# Patient Record
Sex: Female | Born: 1975
Health system: Southern US, Community
[De-identification: ages and names within clinical notes are randomized; demographics above are authoritative.]

## PROBLEM LIST (undated history)

## (undated) DIAGNOSIS — E559 Vitamin D deficiency, unspecified: Secondary | ICD-10-CM

## (undated) DIAGNOSIS — E669 Obesity, unspecified: Secondary | ICD-10-CM

## (undated) HISTORY — DX: Vitamin D deficiency, unspecified: E55.9

## (undated) HISTORY — DX: Obesity, unspecified: E66.9

## (undated) HISTORY — PX: DILATION AND CURETTAGE OF UTERUS: SHX78

---

## 1998-07-19 ENCOUNTER — Ambulatory Visit (HOSPITAL_COMMUNITY): Admission: RE | Admit: 1998-07-19 | Discharge: 1998-07-19 | Payer: Self-pay | Admitting: *Deleted

## 1998-09-14 ENCOUNTER — Ambulatory Visit (HOSPITAL_COMMUNITY): Admission: RE | Admit: 1998-09-14 | Discharge: 1998-09-14 | Payer: Self-pay | Admitting: Obstetrics and Gynecology

## 2012-12-02 ENCOUNTER — Ambulatory Visit: Payer: 59 | Admitting: Obstetrics & Gynecology

## 2013-09-17 ENCOUNTER — Encounter: Payer: Self-pay | Admitting: Obstetrics & Gynecology

## 2013-09-17 ENCOUNTER — Ambulatory Visit (INDEPENDENT_AMBULATORY_CARE_PROVIDER_SITE_OTHER): Payer: 59 | Admitting: Obstetrics & Gynecology

## 2013-09-17 VITALS — BP 130/91 | HR 93 | Temp 97.7°F | Ht 64.0 in | Wt 238.0 lb

## 2013-09-17 DIAGNOSIS — Z01419 Encounter for gynecological examination (general) (routine) without abnormal findings: Secondary | ICD-10-CM

## 2013-09-17 DIAGNOSIS — Z124 Encounter for screening for malignant neoplasm of cervix: Secondary | ICD-10-CM

## 2013-09-17 DIAGNOSIS — Z113 Encounter for screening for infections with a predominantly sexual mode of transmission: Secondary | ICD-10-CM

## 2013-09-17 LAB — RPR

## 2013-09-17 LAB — HIV ANTIBODY (ROUTINE TESTING W REFLEX): HIV: NONREACTIVE

## 2013-09-17 NOTE — Progress Notes (Signed)
Subjective:     Ashley [name] is a 38 y.o. female here for a routine exam.  Current complaints: annual exam. No concerns at this time.  Personal health questionnaire reviewed: yes.   Gynecologic History No LMP recorded. Contraception: vasectomy Last Pap: ASCUS. Results were: normal  Obstetric History OB History  Gravida Para Term Preterm AB SAB TAB Ectopic Multiple Living  4 2 2  2 2    2     # Outcome Date GA Lbr Len/2nd Weight Sex Delivery Anes PTL Lv  4 SAB 2014          3 TRM 08/30/00 4267w0d  7 lb 6 oz (3.345 kg) M SVD Other  Y  2 TRM 01/29/99 2035w0d  7 lb 4 oz (3.289 kg) F SVD None  Y  1 SAB 1997               The following portions of the patient's history were reviewed and updated as appropriate: allergies, current medications, past family history, past medical history, past social history, past surgical history and problem list.  Review of Systems Pertinent items are noted in HPI.   Objective:    BP 130/91  Pulse 93  Temp(Src) 97.7 F (36.5 C)  Ht 5\' 4"  (1.626 m)  Wt 238 lb (107.956 kg)  BMI 40.83 kg/m2  General Appearance:    Alert, cooperative, no distress, appears stated age  Breast Exam:    No tenderness, masses, or nipple abnormality  Abdomen:     Soft, non-tender, bowel sounds active all four quadrants,    no masses, no organomegaly  Genitalia:    Normal female without lesion, discharge or tenderness     Assessment:   Healthy female exam.   Plan:   Return prn

## 2013-09-18 LAB — WET PREP BY MOLECULAR PROBE
Candida species: NEGATIVE
Gardnerella vaginalis: POSITIVE — AB
Trichomonas vaginosis: NEGATIVE

## 2013-09-18 LAB — PAP IG, CT-NG, RFX HPV ASCU
Chlamydia Probe Amp: NEGATIVE
GC Probe Amp: NEGATIVE

## 2013-09-19 NOTE — Patient Instructions (Signed)

## 2013-10-07 ENCOUNTER — Other Ambulatory Visit: Payer: Self-pay | Admitting: *Deleted

## 2013-10-07 DIAGNOSIS — N760 Acute vaginitis: Secondary | ICD-10-CM

## 2013-10-07 DIAGNOSIS — B9689 Other specified bacterial agents as the cause of diseases classified elsewhere: Secondary | ICD-10-CM

## 2013-10-07 MED ORDER — METRONIDAZOLE 500 MG PO TABS: 500.0000 mg | ORAL_TABLET | Freq: Two times a day (BID) | ORAL | Status: DC

## 2014-06-08 ENCOUNTER — Encounter: Payer: Self-pay | Admitting: Obstetrics & Gynecology

## 2014-08-03 ENCOUNTER — Encounter: Payer: Self-pay | Admitting: *Deleted

## 2014-08-04 ENCOUNTER — Encounter: Payer: Self-pay | Admitting: Obstetrics & Gynecology

## 2015-03-17 ENCOUNTER — Encounter: Payer: Self-pay | Admitting: Certified Nurse Midwife

## 2015-03-17 ENCOUNTER — Ambulatory Visit (INDEPENDENT_AMBULATORY_CARE_PROVIDER_SITE_OTHER): Payer: 59 | Admitting: Certified Nurse Midwife

## 2015-03-17 VITALS — BP 144/82 | HR 102 | Temp 98.6°F | Ht 64.0 in | Wt 233.2 lb

## 2015-03-17 DIAGNOSIS — E669 Obesity, unspecified: Secondary | ICD-10-CM

## 2015-03-17 DIAGNOSIS — B3731 Acute candidiasis of vulva and vagina: Secondary | ICD-10-CM

## 2015-03-17 DIAGNOSIS — A499 Bacterial infection, unspecified: Secondary | ICD-10-CM

## 2015-03-17 DIAGNOSIS — Z01419 Encounter for gynecological examination (general) (routine) without abnormal findings: Secondary | ICD-10-CM | POA: Diagnosis not present

## 2015-03-17 DIAGNOSIS — B373 Candidiasis of vulva and vagina: Secondary | ICD-10-CM

## 2015-03-17 DIAGNOSIS — N760 Acute vaginitis: Secondary | ICD-10-CM | POA: Diagnosis not present

## 2015-03-17 DIAGNOSIS — Z113 Encounter for screening for infections with a predominantly sexual mode of transmission: Secondary | ICD-10-CM | POA: Diagnosis not present

## 2015-03-17 DIAGNOSIS — N898 Other specified noninflammatory disorders of vagina: Secondary | ICD-10-CM | POA: Diagnosis not present

## 2015-03-17 DIAGNOSIS — B9689 Other specified bacterial agents as the cause of diseases classified elsewhere: Secondary | ICD-10-CM

## 2015-03-17 LAB — COMPREHENSIVE METABOLIC PANEL
ALK PHOS: 66 U/L (ref 33–115)
ALT: 15 U/L (ref 6–29)
AST: 19 U/L (ref 10–30)
Albumin: 3.8 g/dL (ref 3.6–5.1)
BUN: 10 mg/dL (ref 7–25)
CO2: 23 mmol/L (ref 20–31)
CREATININE: 0.61 mg/dL (ref 0.50–1.10)
Calcium: 9 mg/dL (ref 8.6–10.2)
Chloride: 103 mmol/L (ref 98–110)
Glucose, Bld: 74 mg/dL (ref 65–99)
Potassium: 4.1 mmol/L (ref 3.5–5.3)
SODIUM: 138 mmol/L (ref 135–146)
Total Bilirubin: 0.3 mg/dL (ref 0.2–1.2)
Total Protein: 6.6 g/dL (ref 6.1–8.1)

## 2015-03-17 LAB — CBC WITH DIFFERENTIAL/PLATELET
Basophils Absolute: 0 10*3/uL (ref 0.0–0.1)
Basophils Relative: 0 % (ref 0–1)
Eosinophils Absolute: 0.1 10*3/uL (ref 0.0–0.7)
Eosinophils Relative: 1 % (ref 0–5)
HCT: 38.3 % (ref 36.0–46.0)
Hemoglobin: 12.7 g/dL (ref 12.0–15.0)
Lymphocytes Relative: 29 % (ref 12–46)
Lymphs Abs: 2.8 10*3/uL (ref 0.7–4.0)
MCH: 27.6 pg (ref 26.0–34.0)
MCHC: 33.2 g/dL (ref 30.0–36.0)
MCV: 83.3 fL (ref 78.0–100.0)
MPV: 8.6 fL (ref 8.6–12.4)
Monocytes Absolute: 0.6 10*3/uL (ref 0.1–1.0)
Monocytes Relative: 6 % (ref 3–12)
Neutro Abs: 6.3 10*3/uL (ref 1.7–7.7)
Neutrophils Relative %: 64 % (ref 43–77)
Platelets: 313 10*3/uL (ref 150–400)
RBC: 4.6 MIL/uL (ref 3.87–5.11)
RDW: 14.5 % (ref 11.5–15.5)
WBC: 9.8 10*3/uL (ref 4.0–10.5)

## 2015-03-17 LAB — CHOLESTEROL, TOTAL: CHOLESTEROL: 223 mg/dL — AB (ref 125–200)

## 2015-03-17 LAB — TSH: TSH: 1.918 u[IU]/mL (ref 0.350–4.500)

## 2015-03-17 LAB — TRIGLYCERIDES: TRIGLYCERIDES: 120 mg/dL (ref <150)

## 2015-03-17 LAB — HDL CHOLESTEROL: HDL: 59 mg/dL (ref >=46)

## 2015-03-17 MED ORDER — TINIDAZOLE 500 MG PO TABS: 2.0000 g | ORAL_TABLET | Freq: Every day | ORAL | Status: AC

## 2015-03-17 MED ORDER — FLUCONAZOLE 100 MG PO TABS: 100.0000 mg | ORAL_TABLET | Freq: Once | ORAL | Status: DC

## 2015-03-17 NOTE — Progress Notes (Signed)
Patient ID: Ashley [name], female   DOB: [birth year], 39 y.o.   MRN: [id number]    Subjective:        Ashley [name] is a 39 y.o. female here for a routine exam.  Current complaints: vaginal discharge with odor, denies itching.  Currently sexually active, last sexual intercours about 1 month ago. Is divorced and does not desire any more children.   Desires full STD screening exam.    Personal health questionnaire:  Is patient Ashkenazi Jewish, have a family history of breast and/or ovarian cancer: no Is there a family history of uterine cancer diagnosed at age < 60, gastrointestinal cancer, urinary tract cancer, family member who is a Personnel officer syndrome-associated carrier: no Is the patient overweight and hypertensive, family history of diabetes, personal history of gestational diabetes, preeclampsia or PCOS: yes Is patient over 79, have PCOS,  family history of premature CHD under age 79, diabetes, smoke, have hypertension or peripheral artery disease:  yes At any time, has a partner hit, kicked or otherwise hurt or frightened you?: no Over the past 2 weeks, have you felt down, depressed or hopeless?: no Over the past 2 weeks, have you felt little interest or pleasure in doing things?:no   Gynecologic History Patient's last menstrual period was 02/28/2015 (exact date). Contraception: none Last Pap: 09/17/2013. Results were: normal Last mammogram: N/A.   Obstetric History OB History  Gravida Para Term Preterm AB SAB TAB Ectopic Multiple Living  4 2 2  2 2    2     # Outcome Date GA Lbr Len/2nd Weight Sex Delivery Anes PTL Lv  4 SAB 2014          3 Term 08/30/00 [redacted]w[redacted]d  7 lb 6 oz (3.345 kg) M Vag-Spont Other  Y  2 Term 01/29/99 [redacted]w[redacted]d  7 lb 4 oz (3.289 kg) F Vag-Spont None  Y  1 SAB 1997              History reviewed. No pertinent past medical history.  Past Surgical History  Procedure Laterality Date  . Dilation and curettage of uterus       Current outpatient  prescriptions:  .  fluconazole (DIFLUCAN) 100 MG tablet, Take 1 tablet (100 mg total) by mouth once. Repeat dose in 48-72 hour., Disp: 3 tablet, Rfl: 0 .  metroNIDAZOLE (FLAGYL) 500 MG tablet, Take 1 tablet (500 mg total) by mouth 2 (two) times daily. (Patient not taking: Reported on 03/17/2015), Disp: 14 tablet, Rfl: 0 .  tinidazole (TINDAMAX) 500 MG tablet, Take 4 tablets (2,000 mg total) by mouth daily with breakfast., Disp: 12 tablet, Rfl: 0 No Known Allergies  Social History  Substance Use Topics  . Smoking status: Former Smoker    Start date: 09/17/2009  . Smokeless tobacco: Never Used  . Alcohol Use: No    History reviewed. No pertinent family history.    Review of Systems  Constitutional: negative for fatigue and weight loss Respiratory: negative for cough and wheezing Cardiovascular: negative for chest pain, fatigue and palpitations Gastrointestinal: negative for abdominal pain and change in bowel habits Musculoskeletal:negative for myalgias Neurological: negative for gait problems and tremors Behavioral/Psych: negative for abusive relationship, depression Endocrine: negative for temperature intolerance   Genitourinary:negative for abnormal menstrual periods, genital lesions, hot flashes, and sexual problems. + vaginal discharge Integument/breast: negative for breast lump, breast tenderness, nipple discharge and skin lesion(s)    Objective:       BP 144/82 mmHg  Pulse 102  Temp(Src)  98.6 F (37 C)  Ht  (1.626 m)  Wt 233 lb 3.2 oz (105.779 kg)  BMI 40.01 kg/m2  LMP 02/28/2015 (Exact Date) General:   alert  Skin:   no rash or abnormalities  Lungs:   clear to auscultation bilaterally  Heart:   regular rate and rhythm, S1, S2 normal, no murmur, click, rub or gallop  Breasts:   normal without suspicious masses, skin or nipple changes or axillary nodes  Abdomen:  normal findings: no organomegaly, soft, non-tender and no hernia obese  Pelvis:  External genitalia:  normal general appearance Urinary system: urethral meatus normal and bladder without fullness, nontender Vaginal: normal without tenderness, induration or masses, + thin gray vaginal discharge with odor Cervix: normal appearance, friable on exam Adnexa: normal bimanual exam Uterus: anteverted and non-tender, normal size   +clue cells and occasional yeast bud on wet prep, no trichomonads seen.  Lab Review Urine pregnancy test Labs reviewed yes Radiologic studies reviewed no  50% of 30 min visit spent on counseling and coordination of care.   Assessment:  BV VVC  Healthy female exam.   Obese STD screening Contraception counseling  Elevated B/P in office today Plan:  Encouraged patient to check her blood pressure at work a few more times, if still elevated to contact her PCP.     Education reviewed: calcium supplements, depression evaluation, low fat, low cholesterol diet, safe sex/STD prevention, self breast exams, skin cancer screening and weight bearing exercise. Contraception: none. Follow up in: 1 year.   Meds ordered this encounter  Medications  . tinidazole (TINDAMAX) 500 MG tablet    Sig: Take 4 tablets (2,000 mg total) by mouth daily with breakfast.    Dispense:  12 tablet    Refill:  0  . fluconazole (DIFLUCAN) 100 MG tablet    Sig: Take 1 tablet (100 mg total) by mouth once. Repeat dose in 48-72 hour.    Dispense:  3 tablet    Refill:  0   Orders Placed This Encounter  Procedures  . HIV antibody (with reflex)  . Hepatitis B surface antigen  . RPR  . Hepatitis C antibody  . CBC with Differential/Platelet  . Comprehensive metabolic panel  . TSH  . Cholesterol, total  . Triglycerides  . HDL cholesterol  . Hemoglobin A1c   Discussed Paraguard as an option for [facility]. Pamphlet given.

## 2015-03-17 NOTE — Addendum Note (Signed)
Addended by: Marya Landry D on: 03/17/2015 04:16 PM   Modules accepted: Orders

## 2015-03-18 ENCOUNTER — Encounter: Payer: Self-pay | Admitting: Certified Nurse Midwife

## 2015-03-18 DIAGNOSIS — R7303 Prediabetes: Secondary | ICD-10-CM | POA: Insufficient documentation

## 2015-03-18 LAB — HIV ANTIBODY (ROUTINE TESTING W REFLEX): HIV 1&2 Ab, 4th Generation: NONREACTIVE

## 2015-03-18 LAB — HEPATITIS B SURFACE ANTIGEN: Hepatitis B Surface Ag: NEGATIVE

## 2015-03-18 LAB — HEMOGLOBIN A1C
Hgb A1c MFr Bld: 5.8 % — ABNORMAL HIGH (ref <5.7)
Mean Plasma Glucose: 120 mg/dL — ABNORMAL HIGH (ref <117)

## 2015-03-18 LAB — RPR

## 2015-03-18 LAB — HEPATITIS C ANTIBODY: HCV Ab: NEGATIVE

## 2015-03-19 LAB — PAP IG AND HPV HIGH-RISK: HPV DNA HIGH RISK: NOT DETECTED

## 2015-03-20 LAB — SURESWAB, VAGINOSIS/VAGINITIS PLUS
ATOPOBIUM VAGINAE: 6.2 Log (cells/mL)
C. PARAPSILOSIS, DNA: NOT DETECTED
C. albicans, DNA: NOT DETECTED
C. glabrata, DNA: NOT DETECTED
C. trachomatis RNA, TMA: NOT DETECTED
C. tropicalis, DNA: NOT DETECTED
GARDNERELLA VAGINALIS: 7.7 Log (cells/mL)
LACTOBACILLUS SPECIES: NOT DETECTED Log (cells/mL)
MEGASPHAERA SPECIES: 7.1 Log (cells/mL)
N. GONORRHOEAE RNA, TMA: NOT DETECTED
T. vaginalis RNA, QL TMA: DETECTED — AB

## 2015-03-23 ENCOUNTER — Other Ambulatory Visit: Payer: Self-pay | Admitting: Certified Nurse Midwife

## 2015-04-02 ENCOUNTER — Other Ambulatory Visit: Payer: Self-pay | Admitting: Certified Nurse Midwife

## 2015-04-02 DIAGNOSIS — R7303 Prediabetes: Secondary | ICD-10-CM

## 2015-04-06 ENCOUNTER — Other Ambulatory Visit: Payer: Self-pay | Admitting: *Deleted

## 2015-04-06 DIAGNOSIS — A599 Trichomoniasis, unspecified: Secondary | ICD-10-CM

## 2015-04-06 DIAGNOSIS — N760 Acute vaginitis: Secondary | ICD-10-CM

## 2015-04-06 DIAGNOSIS — B9689 Other specified bacterial agents as the cause of diseases classified elsewhere: Secondary | ICD-10-CM

## 2015-04-06 MED ORDER — METRONIDAZOLE 500 MG PO TABS: ORAL_TABLET | ORAL | Status: DC

## 2015-05-12 ENCOUNTER — Ambulatory Visit: Payer: Self-pay | Admitting: Dietician

## 2016-05-12 ENCOUNTER — Other Ambulatory Visit (HOSPITAL_COMMUNITY): Payer: Self-pay | Admitting: General Surgery

## 2016-05-18 ENCOUNTER — Encounter: Payer: 59 | Attending: General Surgery | Admitting: Dietician

## 2016-05-18 NOTE — Patient Instructions (Signed)
Follow Pre-Op Goals Try Protein Shakes Call [facility] at [phone] when surgery is scheduled to enroll in Pre-Op Class  Things to remember:  Please always be honest with us. We want to support you!  If you have any questions or concerns in between appointments, please call or email Liz, Leslie, or Laurie.  The diet after surgery will be high protein and low in carbohydrate.  Vitamins and calcium need to be taken for the rest of your life.  Feel free to include support people in any classes or appointments.   Supplement recommendations:  Before Surgery   1 Complete Multivitamin with Iron  3000 IU Vitamin D3  After Surgery   2 Chewable Multivitamins  **Best Choice - Bariatric Advantage Advanced Multi EA      3 Chewable Calcium (500 mg each, total 1200-1500 mg per day)  **Best Choice - Celebrate, Bariatric Advantage, or Wellesse  Other Options:    2 Flinstones Complete + up to 100 mg Thiamin + 2000-3000 IU Vitamin D3 + 350-500 mcg Vitamin B12 + 30-45 mg Iron (with history of deficiency)  2 Celebrate MultiComplete with 18 mg Iron (this provides 6000 IU of  Vitamin D3)  4 Celebrate Essential Multi 2 in 1 (has calcium) + 18-60 mg separate  iron  Vitamins and Calcium are available at:   [facility]   [address], [place], [place] [address]   [email or web address]  [email or web address]  [email or web address]   

## 2016-05-18 NOTE — Progress Notes (Signed)
  Pre-Op Assessment Visit:  Pre-Operative Sleeve Gastrectomy Surgery  Medical Nutrition Therapy:  Appt start time: 1600   End time:  1655.  Patient was seen on 05/18/2016 for Pre-Operative Nutrition Assessment. Assessment and [facility] approval [facility] coordinator on 05/18/2016.   Preferred Learning Style:    No preference indicated   Learning Readiness:   Ready  Handouts given during visit include:  Pre-Op Goals Bariatric Surgery Protein Shakes   During the appointment today the following Pre-Op Goals were reviewed with the patient: Maintain or lose weight as instructed by your surgeon Make healthy food choices Begin to limit portion sizes Limited concentrated sugars and fried foods Keep fat/sugar in the single digits per serving on   food labels Practice CHEWING your food  (aim for 30 chews per bite or until applesauce consistency) Practice not drinking 15 minutes before, during, and 30 minutes after each meal/snack Avoid all carbonated beverages  Avoid/limit caffeinated beverages  Avoid all sugar-sweetened beverages Consume 3 meals per day; eat every 3-5 hours Make a list of non-food related activities Aim for 64-100 ounces of FLUID daily  Aim for at least 60-80 grams of PROTEIN daily Look for a liquid protein source that contain ?15 g protein and ?5 g carbohydrate  (ex: shakes, drinks, shots)  Patient-Centered Goals: Would like to lower her cholesterol, lower blood sugar, increase physical activity, and reduce the risk of stroke and heart attack  10 confidence/10 importance scale   Demonstrated degree of understanding via:  Teach Back  Teaching Method Utilized:  Visual Auditory Hands on  Barriers to learning/adherence to lifestyle change: none  [facility] to [facility] the [facility] to enroll in Pre-Op and Post-Op Nutrition Education when surgery date is scheduled.

## 2016-05-31 ENCOUNTER — Ambulatory Visit: Payer: Self-pay | Admitting: Skilled Nursing Facility1

## 2016-06-14 ENCOUNTER — Ambulatory Visit: Payer: Self-pay | Admitting: Dietician

## 2016-06-19 ENCOUNTER — Encounter: Payer: 59 | Attending: General Surgery | Admitting: Dietician

## 2016-06-19 NOTE — Progress Notes (Signed)
  6 Months Supervised Weight Loss Visit:   Pre-Operative Sleeve Gastrectomy Surgery  Medical Nutrition Therapy:  Appt start time: 1215 end time:  1230.  Primary concerns today: Supervised Weight Loss Visit. Returns with a 5 lb weight gain since last month. Has been working at home and doing more frying instead of baking. Kids don't like baked food.  Started new job on 10/27. Previous job she lunch provided. Also feels like she needs more activity since previous job was more activity.   Has cut back on portions. Drinking more water and cutting back on soda. Started working on not drinking during meals and chewing. Has not tried protein shakes yet.   Does not eat much sweets (except Halloween candy).   Weight: 249.8 BMI: 42.2  Preferred Learning Style:   No preference indicated   Learning Readiness:   Ready  Progress Towards Goal(s):  In progress.  Handouts given during visit include:  Pre Op Goals   Nutritional Diagnosis:  [place]-3.3 Obesity related to past poor dietary habits and physical inactivity as evidenced by patient attending supervised weight loss for insurance approval of bariatric surgery.    Intervention:  Nutrition counseling provided. Plan: Plan to walk 30 minutes 3 x week. Cut down on fried foods (3 items per week for now). Try 3 protein shakes in the next month.  Work on chewing well. Start working on waiting 30 minutes after a meal before drinking.   Teaching Method Utilized:  Visual Auditory Hands on  Barriers to learning/adherence to lifestyle change: none  Demonstrated degree of understanding via:  Teach Back   Monitoring/Evaluation:  Dietary intake, exercise, and body weight. Follow up in 1 months for 6 month supervised weight loss visit.

## 2016-06-19 NOTE — Patient Instructions (Signed)
Plan to walk 30 minutes 3 x week. Cut down on fried foods (3 items per week for now). Try 3 protein shakes in the next month.  Work on chewing well. Start working on waiting 30 minutes after a meal before drinking.

## 2016-07-19 ENCOUNTER — Encounter: Payer: 59 | Attending: General Surgery | Admitting: Dietician

## 2016-07-19 NOTE — Patient Instructions (Addendum)
Plan to walk 30 minutes 3 x week. Cut down on fried foods (3 items per week for now). Try different protein shakes in the next month.  Continue to work on chewing well. Continue working on waiting 30 minutes after a meal before drinking.  Try mixing quest shake with skim milk or unsweetened almond or soy milk. Vanilla is ok.  Work on cutting down on bread to 2 slices per day.

## 2016-07-19 NOTE — Progress Notes (Addendum)
  6 Months Supervised Weight Loss Visit:   Pre-Operative Sleeve Gastrectomy Surgery  Medical Nutrition Therapy:  Appt start time: 515 end time:  530  Primary concerns today: Supervised Weight Loss Visit. Returns with a 1 lb weight loss since last month. Tried Quest protein shake and did not like it.   Doing some walking on weekends about 2 x week when shopping. Tried to cut back on fried foods. Cutting back on eating out.   Chewing well most of the time. Enjoying food more!  Started working on not drinking during meals.   Weight: 248.6 lbs BMI: 42.0  Preferred Learning Style:   No preference indicated   Learning Readiness:   Ready  Progress Towards Goal(s):  In progress.  Handouts given during visit include:  Meal card   Nutritional Diagnosis:  [place]-3.3 Obesity related to past poor dietary habits and physical inactivity as evidenced by patient attending supervised weight loss for insurance approval of bariatric surgery.    Intervention:  Nutrition counseling provided. Plan: Plan to walk 30 minutes 3 x week. Cut down on fried foods (3 items per week for now). Try 3 protein shakes in the next month.  Work on chewing well. Start working on waiting 30 minutes after a meal before drinking.   Teaching Method Utilized:  Visual Auditory Hands on  Barriers to learning/adherence to lifestyle change: none  Demonstrated degree of understanding via:  Teach Back   Monitoring/Evaluation:  Dietary intake, exercise, and body weight. Follow up in 1 months for 6 month supervised weight loss visit.

## 2016-08-09 ENCOUNTER — Ambulatory Visit (HOSPITAL_COMMUNITY)
Admission: RE | Admit: 2016-08-09 | Discharge: 2016-08-09 | Disposition: A | Discharge status: Home / Self Care | Payer: 59 | Source: Ambulatory Visit | Attending: General Surgery | Admitting: General Surgery

## 2016-08-09 ENCOUNTER — Other Ambulatory Visit: Payer: Self-pay

## 2016-08-09 DIAGNOSIS — Z01818 Encounter for other preprocedural examination: Secondary | ICD-10-CM | POA: Diagnosis not present

## 2016-08-09 DIAGNOSIS — R05 Cough: Secondary | ICD-10-CM | POA: Diagnosis not present

## 2016-08-16 ENCOUNTER — Encounter: Payer: 59 | Attending: General Surgery | Admitting: Skilled Nursing Facility1

## 2016-08-16 ENCOUNTER — Encounter: Payer: Self-pay | Admitting: Skilled Nursing Facility1

## 2016-08-16 DIAGNOSIS — E6609 Other obesity due to excess calories: Secondary | ICD-10-CM

## 2016-08-16 NOTE — Patient Instructions (Addendum)
-  Try to stay active even if it is cold: the treadmill at your mom's house  -Keep working on cutting out fried foods and bread   -Start working on taking small bites and chewing those bites 30 times before swallowing  -Start working on waiting 30 minutes after you have finished eating before you drink

## 2016-08-16 NOTE — Progress Notes (Signed)
  6 Months Supervised Weight Loss Visit:   Pre-Operative Sleeve Gastrectomy Surgery  Medical Nutrition Therapy:  Appt start time: 515 end time:  530  Primary concerns today: Supervised Weight Loss Visit. Returns with a 2 lb weight gain since last month. Tried Quest protein shake and did not like it.   Doing some walking on weekends about 2 x week when shopping. Tried to cut back on fried foods. Cutting back on eating out.   Pt states she found some protein shakes she liked. Pt states she has not been walking due to the cold.   Chewing well most of the time. Enjoying food more!  Started working on not drinking during meals.   Weight: 250 lbs BMI: 42.38  Preferred Learning Style:   No preference indicated   Learning Readiness:   Ready  Progress Towards Goal(s):  In progress.  Handouts given during visit include:  Meal card   Nutritional Diagnosis:  [place]-3.3 Obesity related to past poor dietary habits and physical inactivity as evidenced by patient attending supervised weight loss for insurance approval of bariatric surgery.    Intervention:  Nutrition counseling provided. Plan: -Try to stay active even if it is cold: the treadmill at your mom's house -Keep working on cutting out fried foods and bread -Start working on taking small bites and chewing those bites 30 times before swallowing -Start working on waiting 30 minutes after you have finished eating before you drink  Teaching Method Utilized:  Visual Auditory Hands on  Barriers to learning/adherence to lifestyle change: none  Demonstrated degree of understanding via:  Teach Back   Monitoring/Evaluation:  Dietary intake, exercise, and body weight. Follow up in 1 months for 6 month supervised weight loss visit.

## 2016-08-17 ENCOUNTER — Ambulatory Visit: Payer: 59 | Admitting: Dietician

## 2016-09-13 ENCOUNTER — Encounter: Payer: 59 | Attending: General Surgery | Admitting: Skilled Nursing Facility1

## 2016-09-13 ENCOUNTER — Encounter: Payer: Self-pay | Admitting: Skilled Nursing Facility1

## 2016-09-13 DIAGNOSIS — E669 Obesity, unspecified: Secondary | ICD-10-CM

## 2016-09-13 NOTE — Progress Notes (Signed)
  6 Months Supervised Weight Loss Visit:   Pre-Operative Sleeve Gastrectomy Surgery  Medical Nutrition Therapy:  Appt start time: 515 end time:  530  Primary concerns today: Supervised Weight Loss Visit. Returns with a 3 lb weight loss since last month. Tried Quest protein shake and did not like it.   Doing some walking on weekends about 2 x week when shopping. Tried to cut back on fried foods. Cutting back on eating out.   Pt states she had the flu a few weeks ago. Pt states everything is going well and is excited for the surgery. Pt states she has cut out milk due to mucous and eating more vegetables.    Weight: 247.6.4 lbs BMI: 41.81  Dietary recall: Ham egg and cheese sandwich Fred fish Sandwich from [employer] Chili  Bagel with butter Beverages: water,   Preferred Learning Style:   No preference indicated   Learning Readiness:   Ready  Progress Towards Goal(s):  In progress.  Handouts given during visit include:  Meal card   Nutritional Diagnosis:  [place]-3.3 Obesity related to past poor dietary habits and physical inactivity as evidenced by patient attending supervised weight loss for insurance approval of bariatric surgery.    Intervention:  Nutrition counseling provided. Plan: -Try to stay active even if it is cold: the treadmill at your mom's house -Keep working on cutting out fried foods and bread -Start working on taking small bites and chewing those bites 30 times before swallowing -Start working on waiting 30 minutes after you have finished eating before you drink  Teaching Method Utilized:  Visual Auditory Hands on  Barriers to learning/adherence to lifestyle change: none  Demonstrated degree of understanding via:  Teach Back   Monitoring/Evaluation:  Dietary intake, exercise, and body weight. Follow up in 1 months for 6 month supervised weight loss visit.

## 2016-09-18 ENCOUNTER — Ambulatory Visit: Payer: 59 | Admitting: Dietician

## 2016-10-12 ENCOUNTER — Encounter: Payer: 59 | Attending: General Surgery | Admitting: Skilled Nursing Facility1

## 2016-10-12 ENCOUNTER — Encounter: Payer: Self-pay | Admitting: Skilled Nursing Facility1

## 2016-10-12 DIAGNOSIS — E669 Obesity, unspecified: Secondary | ICD-10-CM

## 2016-10-12 NOTE — Progress Notes (Signed)
  6 Months Supervised Weight Loss Visit:   Pre-Operative Sleeve Gastrectomy Surgery  Medical Nutrition Therapy:  Appt start time: 515 end time:  530  Primary concerns today: Supervised Weight Loss Visit. Returns with a 3 lb weight loss since last month. Tried Quest protein shake and did not like it.   Doing some walking on weekends about 2 x week when shopping. Tried to cut back on fried foods. Cutting back on eating out.   Pt states she had the flu a few weeks ago. Pt states everything is going well and is excited for the surgery. Pt states she has cut out milk due to mucous and eating more vegetables.    Pt arrives having lost 3 pounds. Pt states this is her last appointment.   Weight: 244.5 lbs BMI: 41.32  Dietary recall: bojangles----granola bar Fred fish Sandwich from [employer] Protein barf Chili  Bagel with butter Beverages: water,   Preferred Learning Style:   No preference indicated   Learning Readiness:   Ready  Progress Towards Goal(s):  In progress.  Handouts given during visit include:  Meal card   Nutritional Diagnosis:  [place]-3.3 Obesity related to past poor dietary habits and physical inactivity as evidenced by patient attending supervised weight loss for insurance approval of bariatric surgery.    Intervention:  Nutrition counseling provided. Plan: -Try to stay active even if it is cold: the treadmill at your mom's house -Keep working on cutting out fried foods and bread -Start working on taking small bites and chewing those bites 30 times before swallowing -Start working on waiting 30 minutes after you have finished eating before you drink  Teaching Method Utilized:  Visual Auditory Hands on  Barriers to learning/adherence to lifestyle change: none  Demonstrated degree of understanding via:  Teach Back   Monitoring/Evaluation:  Dietary intake, exercise, and body weight. Follow up in 1 months for 6 month supervised weight loss visit.

## 2016-10-23 ENCOUNTER — Encounter: Payer: Self-pay | Admitting: Skilled Nursing Facility1

## 2016-10-23 ENCOUNTER — Encounter: Payer: 59 | Admitting: Skilled Nursing Facility1

## 2016-10-23 DIAGNOSIS — E669 Obesity, unspecified: Secondary | ICD-10-CM

## 2016-10-23 NOTE — Progress Notes (Signed)
  Pre-Operative Nutrition Class:  Appt start time: 3943   End time:  1830.  Patient was seen on 10/23/16 for Pre-Operative Bariatric Surgery Education at [facility].   Surgery date:  Surgery type: sleeve gastrectomy Start weight at [facility]: 250 Weight today: 246.3  TANITA  BODY COMP RESULTS     BMI (kg/m^2) N/A   Fat Mass (lbs)    Fat Free Mass (lbs)    Total Body Water (lbs)    Samples given per MNT protocol. Patient educated on appropriate usage: Bariatric Advantage Multivitamin Lot # [id number] Exp: 6/19  Bariatric Advantage Calcium Citrate Lot # [id number] Exp: 01/12/17  Renee Pain Protein Shake Lot # [id number] Exp: 06/06/17   The following the learning objectives were met by the patient during this course:  Identify Pre-Op Dietary Goals and will begin 2 weeks pre-operatively  Identify appropriate sources of fluids and proteins   State protein recommendations and appropriate sources pre and post-operatively  Identify Post-Operative Dietary Goals and will follow for 2 weeks post-operatively  Identify appropriate multivitamin and calcium sources  Describe the need for physical activity post-operatively and will follow MD recommendations  State when to call healthcare provider regarding medication questions or post-operative complications  Handouts given during class include:  Pre-Op Bariatric Surgery Diet Handout  Protein Shake Handout  Post-Op Bariatric Surgery Nutrition Handout  [facility] Program Information Flyer  Support Group Information Flyer  [facility] Bariatric Supplements Price List  Follow-Up Plan: Patient will follow-up at [facility] 2 weeks post operatively for diet advancement per MD.

## 2016-10-27 NOTE — Progress Notes (Signed)
NEED ORDERS IN EPIC FOR 4-3- SURGERY PRE OP IS 3-28 

## 2016-10-31 ENCOUNTER — Ambulatory Visit: Payer: Self-pay | Admitting: Surgery

## 2016-11-01 ENCOUNTER — Encounter (HOSPITAL_COMMUNITY)
Admission: RE | Admit: 2016-11-01 | Discharge: 2016-11-01 | Disposition: A | Discharge status: Home / Self Care | Payer: 59 | Source: Ambulatory Visit | Attending: General Surgery | Admitting: General Surgery

## 2016-11-01 ENCOUNTER — Encounter (HOSPITAL_COMMUNITY): Payer: Self-pay

## 2016-11-01 DIAGNOSIS — Z01812 Encounter for preprocedural laboratory examination: Secondary | ICD-10-CM | POA: Insufficient documentation

## 2016-11-01 DIAGNOSIS — Z6841 Body Mass Index (BMI) 40.0 and over, adult: Secondary | ICD-10-CM | POA: Insufficient documentation

## 2016-11-01 LAB — COMPREHENSIVE METABOLIC PANEL
ALK PHOS: 66 U/L (ref 38–126)
ALT: 16 U/L (ref 14–54)
AST: 20 U/L (ref 15–41)
Albumin: 3.6 g/dL (ref 3.5–5.0)
Anion gap: 7 (ref 5–15)
BILIRUBIN TOTAL: 0.5 mg/dL (ref 0.3–1.2)
BUN: 12 mg/dL (ref 6–20)
CALCIUM: 8.6 mg/dL — AB (ref 8.9–10.3)
CHLORIDE: 106 mmol/L (ref 101–111)
CO2: 25 mmol/L (ref 22–32)
CREATININE: 0.81 mg/dL (ref 0.44–1.00)
Glucose, Bld: 90 mg/dL (ref 65–99)
Potassium: 4 mmol/L (ref 3.5–5.1)
Sodium: 138 mmol/L (ref 135–145)
TOTAL PROTEIN: 7.1 g/dL (ref 6.5–8.1)

## 2016-11-01 LAB — CBC WITH DIFFERENTIAL/PLATELET
BASOS ABS: 0 10*3/uL (ref 0.0–0.1)
Basophils Relative: 0 %
EOS PCT: 1 %
Eosinophils Absolute: 0.1 10*3/uL (ref 0.0–0.7)
HEMATOCRIT: 37.4 % (ref 36.0–46.0)
HEMOGLOBIN: 12.2 g/dL (ref 12.0–15.0)
LYMPHS ABS: 3 10*3/uL (ref 0.7–4.0)
Lymphocytes Relative: 32 %
MCH: 26.8 pg (ref 26.0–34.0)
MCHC: 32.6 g/dL (ref 30.0–36.0)
MCV: 82 fL (ref 78.0–100.0)
Monocytes Absolute: 0.5 10*3/uL (ref 0.1–1.0)
Monocytes Relative: 5 %
Neutro Abs: 5.9 10*3/uL (ref 1.7–7.7)
Neutrophils Relative %: 62 %
PLATELETS: 301 10*3/uL (ref 150–400)
RBC: 4.56 MIL/uL (ref 3.87–5.11)
RDW: 15.2 % (ref 11.5–15.5)
WBC: 9.5 10*3/uL (ref 4.0–10.5)

## 2016-11-01 NOTE — Patient Instructions (Addendum)
Duard LarsenChandra F Thrun  11/01/2016   Your procedure is scheduled on: 11/07/2016    [facility]  [place] to 3rd floor to  Short Stay Center at   0515 AM.  Call this number if you have problems the morning of surgery [phone]   Remember: ONLY 1 PERSON MAY GO WITH YOU TO SHORT STAY TO GET  READY MORNING OF YOUR SURGERY.  Do not eat food or drink liquids :After Midnight.     Take these medicines the morning of surgery with A SIP OF WATER: none                                 You may not have any metal on your body including hair pins and              piercings  Do not wear jewelry, make-up, lotions, powders or perfumes, deodorant             Do not wear nail polish.  Do not shave  48 hours prior to surgery.     Do not bring valuables to the hospital. [place] IS NOT             RESPONSIBLE   FOR VALUABLES.  Contacts, dentures or bridgework may not be worn into surgery.  Leave suitcase in the car. After surgery it may be brought to your room.       Special Instructions: coughing and deep breathing exercises, leg exercises  Incentive Spirometer  An incentive spirometer is a tool that can help keep your lungs clear and active. This tool measures how well you are filling your lungs with each breath. Taking long deep breaths may help reverse or decrease the chance of developing breathing (pulmonary) problems (especially infection) following:  A long period of time when you are unable to move or be active. BEFORE THE PROCEDURE   If the spirometer includes an indicator to show your best effort, your nurse or respiratory therapist will set it to a desired goal.  If possible, sit up straight or lean slightly forward. Try not to slouch.  Hold the incentive spirometer in an upright position. INSTRUCTIONS FOR USE  1. Sit on the edge of your bed if possible, or sit up as far as you can in bed or on a chair. 2. Hold the  incentive spirometer in an upright position. 3. Breathe out normally. 4. Place the mouthpiece in your mouth and seal your lips tightly around it. 5. Breathe in slowly and as deeply as possible, raising the piston or the ball toward the top of the column. 6. Hold your breath for 3-5 seconds or for as long as possible. Allow the piston or ball to fall to the bottom of the column. 7. Remove the mouthpiece from your mouth and breathe out normally. 8. Rest for a few seconds and repeat Steps 1 through 7 at least 10 times every 1-2 hours when you are awake. Take your time and take a few normal breaths between deep breaths. 9. The spirometer may include an indicator to show your best effort. Use the indicator as a goal to work toward during each repetition. 10. After each set of 10 deep breaths, practice coughing to be sure your lungs are clear. If you have an incision (the cut  made at the time of surgery), support your incision when coughing by placing a pillow or rolled up towels firmly against it. Once you are able to get out of bed, walk around indoors and cough well. You may stop using the incentive spirometer when instructed by your caregiver.  RISKS AND COMPLICATIONS  Take your time so you do not get dizzy or light-headed.  If you are in pain, you may need to take or ask for pain medication before doing incentive spirometry. It is harder to take a deep breath if you are having pain. AFTER USE  Rest and breathe slowly and easily.  It can be helpful to keep track of a log of your progress. Your caregiver can provide you with a simple table to help with this. If you are using the spirometer at home, follow these instructions: SEEK MEDICAL CARE IF:   You are having difficultly using the spirometer.  You have trouble using the spirometer as often as instructed.  Your pain medication is not giving enough relief while using the spirometer.  You develop fever of 100.5 F (38.1 C) or  higher. SEEK IMMEDIATE MEDICAL CARE IF:   You cough up bloody sputum that had not been present before.  You develop fever of 102 F (38.9 C) or greater.  You develop worsening pain at or near the incision site. MAKE SURE YOU:   Understand these instructions.  Will watch your condition.  Will get help right away if you are not doing well or get worse. Document Released: 12/04/2006 Document Revised: 10/16/2011 Document Reviewed: 02/04/2007 ExitCare Patient Information 2014 [place] [employer].   ________________________________________________________________________               Please read over the following fact sheets you were given: _____________________________________________________________________             [facility] - Preparing for Surgery Before surgery, you can play an important role.  Because skin is not sterile, your skin needs to be as free of germs as possible.  You can reduce the number of germs on your skin by washing with CHG (chlorahexidine gluconate) soap before surgery.  CHG is an antiseptic cleaner which kills germs and bonds with the skin to continue killing germs even after washing. Please DO NOT use if you have an allergy to CHG or antibacterial soaps.  If your skin becomes reddened/irritated stop using the CHG and inform your nurse when you arrive at Short Stay. Do not shave (including legs and underarms) for at least 48 hours prior to the first CHG shower.  You may shave your face/neck. Please follow these instructions carefully:  1.  Shower with CHG Soap the night before surgery and the  morning of Surgery.  2.  If you choose to wash your hair, wash your hair first as usual with your  normal  shampoo.  3.  After you shampoo, rinse your hair and body thoroughly to remove the  shampoo.                           4.  Use CHG as you would any other liquid soap.  You can apply chg directly  to the skin and wash                       Gently with a scrungie or  clean washcloth.  5.  Apply the CHG Soap to your body ONLY FROM THE  NECK DOWN.   Do not use on face/ open                           Wound or open sores. Avoid contact with eyes, ears mouth and genitals (private parts).                       Wash face,  Genitals (private parts) with your normal soap.             6.  Wash thoroughly, paying special attention to the area where your surgery  will be performed.  7.  Thoroughly rinse your body with warm water from the neck down.  8.  DO NOT shower/wash with your normal soap after using and rinsing off  the CHG Soap.                9.  Pat yourself dry with a clean towel.            10.  Wear clean pajamas.            11.  Place clean sheets on your bed the night of your first shower and do not  sleep with pets. Day of Surgery : Do not apply any lotions/deodorants the morning of surgery.  Please wear clean clothes to the hospital/surgery center.  FAILURE TO FOLLOW THESE INSTRUCTIONS MAY RESULT IN THE CANCELLATION OF YOUR SURGERY PATIENT SIGNATURE_________________________________  NURSE SIGNATURE__________________________________  ________________________________________________________________________

## 2016-11-06 MED FILL — oxyCODONE HCL 5 MG/5ML SOLN: 5 | 5 days supply | Qty: 100 | Fill #0

## 2016-11-06 NOTE — Anesthesia Preprocedure Evaluation (Addendum)
Anesthesia Evaluation  Patient identified by MRN, date of birth, ID band Patient awake    Reviewed: Allergy & Precautions, NPO status , Patient's Chart, lab work & pertinent test results  History of Anesthesia Complications Negative for: history of anesthetic complications  Airway Mallampati: III  TM Distance: >3 FB Neck ROM: Full    Dental  (+) Teeth Intact, Dental Advisory Given   Pulmonary neg pulmonary ROS, former smoker,    Pulmonary exam normal        Cardiovascular negative cardio ROS Normal cardiovascular exam     Neuro/Psych negative neurological ROS     GI/Hepatic negative GI ROS, Neg liver ROS,   Endo/Other  Morbid obesity  Renal/GU negative Renal ROS     Musculoskeletal negative musculoskeletal ROS (+)   Abdominal   Peds  Hematology negative hematology ROS (+)   Anesthesia Other Findings Day of surgery medications reviewed with the patient.  Reproductive/Obstetrics                            Anesthesia Physical Anesthesia Plan  ASA: III  Anesthesia Plan: General   Post-op Pain Management:    Induction: Intravenous  Airway Management Planned: Oral ETT  Additional Equipment:   Intra-op Plan:   Post-operative Plan: Extubation in OR  Informed Consent:   Plan Discussed with:   Anesthesia Plan Comments:         Anesthesia Quick Evaluation

## 2016-11-07 ENCOUNTER — Inpatient Hospital Stay (HOSPITAL_COMMUNITY)
Admission: RE | Admit: 2016-11-07 | Discharge: 2016-11-08 | DRG: 621 | Disposition: A | Discharge status: Home / Self Care | Payer: 59 | Source: Ambulatory Visit | Attending: General Surgery | Admitting: General Surgery

## 2016-11-07 ENCOUNTER — Encounter (HOSPITAL_COMMUNITY): Payer: Self-pay

## 2016-11-07 ENCOUNTER — Inpatient Hospital Stay (HOSPITAL_COMMUNITY): Payer: 59 | Admitting: Anesthesiology

## 2016-11-07 ENCOUNTER — Encounter (HOSPITAL_COMMUNITY)
Admission: RE | Disposition: A | Discharge status: Home / Self Care | Payer: Self-pay | Source: Ambulatory Visit | Attending: General Surgery

## 2016-11-07 DIAGNOSIS — I10 Essential (primary) hypertension: Secondary | ICD-10-CM | POA: Diagnosis present

## 2016-11-07 DIAGNOSIS — IMO0001 Reserved for inherently not codable concepts without codable children: Secondary | ICD-10-CM

## 2016-11-07 DIAGNOSIS — Z6841 Body Mass Index (BMI) 40.0 and over, adult: Secondary | ICD-10-CM

## 2016-11-07 DIAGNOSIS — Z87891 Personal history of nicotine dependence: Secondary | ICD-10-CM | POA: Diagnosis not present

## 2016-11-07 DIAGNOSIS — E669 Obesity, unspecified: Secondary | ICD-10-CM | POA: Diagnosis present

## 2016-11-07 HISTORY — PX: LAPAROSCOPIC GASTRIC SLEEVE RESECTION: SHX5895

## 2016-11-07 HISTORY — PX: UPPER GI ENDOSCOPY: SHX6162

## 2016-11-07 LAB — CBC
HCT: 35.6 % — ABNORMAL LOW (ref 36.0–46.0)
Hemoglobin: 11.6 g/dL — ABNORMAL LOW (ref 12.0–15.0)
MCH: 27.4 pg (ref 26.0–34.0)
MCHC: 32.6 g/dL (ref 30.0–36.0)
MCV: 84.2 fL (ref 78.0–100.0)
PLATELETS: 288 10*3/uL (ref 150–400)
RBC: 4.23 MIL/uL (ref 3.87–5.11)
RDW: 15.1 % (ref 11.5–15.5)
WBC: 10.4 10*3/uL (ref 4.0–10.5)

## 2016-11-07 LAB — HEMOGLOBIN AND HEMATOCRIT, BLOOD
HCT: 35.4 % — ABNORMAL LOW (ref 36.0–46.0)
HEMOGLOBIN: 11.7 g/dL — AB (ref 12.0–15.0)

## 2016-11-07 LAB — CREATININE, SERUM
CREATININE: 0.75 mg/dL (ref 0.44–1.00)
GFR calc Af Amer: >60 mL/min (ref >60)
GFR calc non Af Amer: >60 mL/min (ref >60)

## 2016-11-07 LAB — PREGNANCY, URINE: PREG TEST UR: NEGATIVE

## 2016-11-07 SURGERY — GASTRECTOMY, SLEEVE, LAPAROSCOPIC
Anesthesia: General | Site: Abdomen

## 2016-11-07 MED ORDER — SUGAMMADEX SODIUM 200 MG/2ML IV SOLN
INTRAVENOUS | Status: DC | PRN
  Administered 2016-11-07: 200 mg via INTRAVENOUS

## 2016-11-07 MED ORDER — ONDANSETRON HCL 4 MG/2ML IJ SOLN
INTRAMUSCULAR | Status: DC | PRN
  Administered 2016-11-07: 4 mg via INTRAVENOUS

## 2016-11-07 MED ORDER — CEFOTETAN DISODIUM-DEXTROSE 2-2.08 GM-% IV SOLR
INTRAVENOUS | Status: AC
  Filled 2016-11-07: qty 50

## 2016-11-07 MED ORDER — DEXAMETHASONE SODIUM PHOSPHATE 10 MG/ML IJ SOLN: 4.0000 mg | INTRAMUSCULAR | Status: DC

## 2016-11-07 MED ORDER — SUFENTANIL CITRATE 50 MCG/ML IV SOLN
INTRAVENOUS | Status: AC
  Filled 2016-11-07: qty 1

## 2016-11-07 MED ORDER — HYDROMORPHONE HCL 1 MG/ML IJ SOLN
INTRAMUSCULAR | Status: AC
  Filled 2016-11-07: qty 1

## 2016-11-07 MED ORDER — SCOPOLAMINE 1 MG/3DAYS TD PT72
1.0000 | MEDICATED_PATCH | TRANSDERMAL | Status: DC
  Filled 2016-11-07: qty 1

## 2016-11-07 MED ORDER — SCOPOLAMINE 1 MG/3DAYS TD PT72
1.0000 | MEDICATED_PATCH | TRANSDERMAL | Status: DC
  Administered 2016-11-07: 1.5 mg via TRANSDERMAL

## 2016-11-07 MED ORDER — SUCCINYLCHOLINE CHLORIDE 200 MG/10ML IV SOSY
PREFILLED_SYRINGE | INTRAVENOUS | Status: AC
  Filled 2016-11-07: qty 10

## 2016-11-07 MED ORDER — DEXAMETHASONE SODIUM PHOSPHATE 10 MG/ML IJ SOLN
INTRAMUSCULAR | Status: DC | PRN
  Administered 2016-11-07: 10 mg via INTRAVENOUS

## 2016-11-07 MED ORDER — ENOXAPARIN SODIUM 30 MG/0.3ML ~~LOC~~ SOLN
30.0000 mg | Freq: Two times a day (BID) | SUBCUTANEOUS | Status: DC
  Administered 2016-11-08: 30 mg via SUBCUTANEOUS
  Filled 2016-11-07: qty 0.3

## 2016-11-07 MED ORDER — ACETAMINOPHEN 500 MG PO TABS
1000.0000 mg | ORAL_TABLET | ORAL | Status: AC
  Administered 2016-11-07: 1000 mg via ORAL
  Filled 2016-11-07: qty 2

## 2016-11-07 MED ORDER — DEXAMETHASONE SODIUM PHOSPHATE 10 MG/ML IJ SOLN
INTRAMUSCULAR | Status: AC
  Filled 2016-11-07: qty 1

## 2016-11-07 MED ORDER — ROCURONIUM BROMIDE 100 MG/10ML IV SOLN
INTRAVENOUS | Status: DC | PRN
  Administered 2016-11-07: 45 mg via INTRAVENOUS
  Administered 2016-11-07: 5 mg via INTRAVENOUS

## 2016-11-07 MED ORDER — CHLORHEXIDINE GLUCONATE CLOTH 2 % EX PADS: 6.0000 | MEDICATED_PAD | Freq: Once | CUTANEOUS | Status: DC

## 2016-11-07 MED ORDER — LACTATED RINGERS IV SOLN
INTRAVENOUS | Status: DC | PRN
  Administered 2016-11-07 (×3): via INTRAVENOUS

## 2016-11-07 MED ORDER — ROCURONIUM BROMIDE 50 MG/5ML IV SOSY
PREFILLED_SYRINGE | INTRAVENOUS | Status: AC
  Filled 2016-11-07: qty 5

## 2016-11-07 MED ORDER — 0.9 % SODIUM CHLORIDE (POUR BTL) OPTIME
TOPICAL | Status: DC | PRN
  Administered 2016-11-07: 1000 mL

## 2016-11-07 MED ORDER — CELECOXIB 200 MG PO CAPS
400.0000 mg | ORAL_CAPSULE | ORAL | Status: AC
  Administered 2016-11-07: 400 mg via ORAL
  Filled 2016-11-07: qty 2

## 2016-11-07 MED ORDER — PHENYLEPHRINE 40 MCG/ML (10ML) SYRINGE FOR IV PUSH (FOR BLOOD PRESSURE SUPPORT)
PREFILLED_SYRINGE | INTRAVENOUS | Status: AC
  Filled 2016-11-07: qty 10

## 2016-11-07 MED ORDER — OXYCODONE HCL 5 MG/5ML PO SOLN
5.0000 mg | ORAL | Status: DC | PRN
  Administered 2016-11-07: 10 mg via ORAL
  Administered 2016-11-08 (×2): 5 mg via ORAL
  Filled 2016-11-07 (×2): qty 10

## 2016-11-07 MED ORDER — BUPIVACAINE HCL (PF) 0.25 % IJ SOLN
INTRAMUSCULAR | Status: AC
  Filled 2016-11-07: qty 30

## 2016-11-07 MED ORDER — SCOPOLAMINE 1 MG/3DAYS TD PT72
MEDICATED_PATCH | TRANSDERMAL | Status: AC
  Filled 2016-11-07: qty 1

## 2016-11-07 MED ORDER — MORPHINE SULFATE (PF) 4 MG/ML IV SOLN
1.0000 mg | INTRAVENOUS | Status: DC | PRN
  Administered 2016-11-07: 2 mg via INTRAVENOUS
  Filled 2016-11-07: qty 1

## 2016-11-07 MED ORDER — LIDOCAINE HCL (CARDIAC) 20 MG/ML IV SOLN
INTRAVENOUS | Status: DC | PRN
  Administered 2016-11-07: 25 mg via INTRATRACHEAL
  Administered 2016-11-07: 100 mg via INTRAVENOUS

## 2016-11-07 MED ORDER — LIDOCAINE 2% (20 MG/ML) 5 ML SYRINGE
INTRAMUSCULAR | Status: AC
  Filled 2016-11-07: qty 5

## 2016-11-07 MED ORDER — BUPIVACAINE LIPOSOME 1.3 % IJ SUSP
20.0000 mL | Freq: Once | INTRAMUSCULAR | Status: DC
  Filled 2016-11-07: qty 20

## 2016-11-07 MED ORDER — APREPITANT 40 MG PO CAPS
40.0000 mg | ORAL_CAPSULE | ORAL | Status: AC
  Administered 2016-11-07: 40 mg via ORAL
  Filled 2016-11-07: qty 1

## 2016-11-07 MED ORDER — MIDAZOLAM HCL 5 MG/5ML IJ SOLN
INTRAMUSCULAR | Status: DC | PRN
  Administered 2016-11-07: 2 mg via INTRAVENOUS

## 2016-11-07 MED ORDER — CEFOTETAN DISODIUM-DEXTROSE 2-2.08 GM-% IV SOLR
2.0000 g | INTRAVENOUS | Status: AC
  Administered 2016-11-07: 2 g via INTRAVENOUS

## 2016-11-07 MED ORDER — SUFENTANIL CITRATE 50 MCG/ML IV SOLN
INTRAVENOUS | Status: DC | PRN
  Administered 2016-11-07: 5 ug via INTRAVENOUS
  Administered 2016-11-07: 10 ug via INTRAVENOUS
  Administered 2016-11-07 (×2): 5 ug via INTRAVENOUS

## 2016-11-07 MED ORDER — STERILE WATER FOR IRRIGATION IR SOLN
Status: DC | PRN
  Administered 2016-11-07: 1000 mL

## 2016-11-07 MED ORDER — SUCCINYLCHOLINE CHLORIDE 20 MG/ML IJ SOLN
INTRAMUSCULAR | Status: DC | PRN
  Administered 2016-11-07: 140 mg via INTRAVENOUS

## 2016-11-07 MED ORDER — SODIUM CHLORIDE 0.9 % IJ SOLN
INTRAMUSCULAR | Status: AC
  Filled 2016-11-07: qty 10

## 2016-11-07 MED ORDER — PROMETHAZINE HCL 25 MG/ML IJ SOLN
INTRAMUSCULAR | Status: AC
  Filled 2016-11-07: qty 1

## 2016-11-07 MED ORDER — MIDAZOLAM HCL 2 MG/2ML IJ SOLN
INTRAMUSCULAR | Status: AC
Start: 2016-11-07 — End: 2016-11-07
  Filled 2016-11-07: qty 2

## 2016-11-07 MED ORDER — BUPIVACAINE LIPOSOME 1.3 % IJ SUSP
INTRAMUSCULAR | Status: DC | PRN
  Administered 2016-11-07: 20 mL

## 2016-11-07 MED ORDER — PROMETHAZINE HCL 25 MG/ML IJ SOLN: 6.2500 mg | INTRAMUSCULAR | Status: DC | PRN

## 2016-11-07 MED ORDER — BUPIVACAINE HCL 0.25 % IJ SOLN
INTRAMUSCULAR | Status: DC | PRN
  Administered 2016-11-07: 30 mL

## 2016-11-07 MED ORDER — PANTOPRAZOLE SODIUM 40 MG IV SOLR
40.0000 mg | Freq: Every day | INTRAVENOUS | Status: DC
  Administered 2016-11-07: 40 mg via INTRAVENOUS
  Filled 2016-11-07: qty 40

## 2016-11-07 MED ORDER — ONDANSETRON HCL 4 MG/2ML IJ SOLN
INTRAMUSCULAR | Status: AC
  Filled 2016-11-07: qty 2

## 2016-11-07 MED ORDER — ONDANSETRON HCL 4 MG/2ML IJ SOLN
4.0000 mg | INTRAMUSCULAR | Status: DC | PRN
  Administered 2016-11-07: 4 mg via INTRAVENOUS
  Filled 2016-11-07: qty 2

## 2016-11-07 MED ORDER — ACETAMINOPHEN 10 MG/ML IV SOLN
INTRAVENOUS | Status: AC
  Filled 2016-11-07: qty 100

## 2016-11-07 MED ORDER — HYDROMORPHONE HCL 1 MG/ML IJ SOLN
0.2500 mg | INTRAMUSCULAR | Status: DC | PRN
  Administered 2016-11-07 (×2): 0.5 mg via INTRAVENOUS

## 2016-11-07 MED ORDER — PREMIER PROTEIN SHAKE: 2.0000 [oz_av] | ORAL | Status: DC

## 2016-11-07 MED ORDER — ENOXAPARIN SODIUM 40 MG/0.4ML ~~LOC~~ SOLN
40.0000 mg | SUBCUTANEOUS | Status: AC
  Administered 2016-11-07: 40 mg via SUBCUTANEOUS
  Filled 2016-11-07: qty 0.4

## 2016-11-07 MED ORDER — SODIUM CHLORIDE 0.9 % IV SOLN
INTRAVENOUS | Status: DC
  Administered 2016-11-07: 75 mL/h via INTRAVENOUS

## 2016-11-07 MED ORDER — GABAPENTIN 300 MG PO CAPS
300.0000 mg | ORAL_CAPSULE | ORAL | Status: AC
  Administered 2016-11-07: 300 mg via ORAL
  Filled 2016-11-07: qty 1

## 2016-11-07 MED ORDER — PROPOFOL 10 MG/ML IV BOLUS
INTRAVENOUS | Status: DC | PRN
  Administered 2016-11-07: 50 mg via INTRAVENOUS
  Administered 2016-11-07: 250 mg via INTRAVENOUS

## 2016-11-07 MED ORDER — ACETAMINOPHEN 325 MG PO TABS
650.0000 mg | ORAL_TABLET | ORAL | Status: DC | PRN
Start: 2016-11-07 — End: 2016-11-08

## 2016-11-07 MED ORDER — LACTATED RINGERS IR SOLN
Status: DC | PRN
  Administered 2016-11-07: 1000 mL

## 2016-11-07 MED ORDER — PHENYLEPHRINE HCL 10 MG/ML IJ SOLN
INTRAMUSCULAR | Status: DC | PRN
  Administered 2016-11-07 (×2): 80 ug via INTRAVENOUS

## 2016-11-07 MED ORDER — ACETAMINOPHEN 160 MG/5ML PO SOLN: 325.0000 mg | ORAL | Status: DC | PRN

## 2016-11-07 MED ORDER — PROPOFOL 10 MG/ML IV BOLUS
INTRAVENOUS | Status: AC
  Filled 2016-11-07: qty 40

## 2016-11-07 SURGICAL SUPPLY — 57 items
APPLIER CLIP 5 13 M/L LIGAMAX5 (MISCELLANEOUS)
APPLIER CLIP ROT 10 11.4 M/L (STAPLE)
APPLIER CLIP ROT 13.4 12 LRG (CLIP)
BAG LAPAROSCOPIC 12 15 PORT 16 (BASKET) ×2 IMPLANT
BAG RETRIEVAL 12/15 (BASKET) ×3
BANDAGE ADH SHEER 1  50/CT (GAUZE/BANDAGES/DRESSINGS) ×18 IMPLANT
BENZOIN TINCTURE PRP APPL 2/3 (GAUZE/BANDAGES/DRESSINGS) ×3 IMPLANT
BLADE SURG SZ11 CARB STEEL (BLADE) ×3 IMPLANT
CABLE HIGH FREQUENCY MONO STRZ (ELECTRODE) ×3 IMPLANT
CHLORAPREP W/TINT 26ML (MISCELLANEOUS) ×6 IMPLANT
CLIP APPLIE 5 13 M/L LIGAMAX5 (MISCELLANEOUS) IMPLANT
CLIP APPLIE ROT 10 11.4 M/L (STAPLE) IMPLANT
CLIP APPLIE ROT 13.4 12 LRG (CLIP) IMPLANT
COVER SURGICAL LIGHT HANDLE (MISCELLANEOUS) ×3 IMPLANT
DRAIN CHANNEL 19F RND (DRAIN) IMPLANT
ELECT REM PT RETURN 15FT ADLT (MISCELLANEOUS) ×3 IMPLANT
EVACUATOR SILICONE 100CC (DRAIN) IMPLANT
GAUZE SPONGE 4X4 12PLY STRL (GAUZE/BANDAGES/DRESSINGS) IMPLANT
GLOVE BIOGEL PI IND STRL 7.0 (GLOVE) ×2 IMPLANT
GLOVE BIOGEL PI INDICATOR 7.0 (GLOVE) ×1
GLOVE SURG SS PI 7.0 STRL IVOR (GLOVE) ×3 IMPLANT
GOWN STRL REUS W/TWL LRG LVL3 (GOWN DISPOSABLE) ×3 IMPLANT
GOWN STRL REUS W/TWL XL LVL3 (GOWN DISPOSABLE) ×9 IMPLANT
GRASPER SUT TROCAR 14GX15 (MISCELLANEOUS) ×3 IMPLANT
HANDLE STAPLE EGIA 4 XL (STAPLE) ×3 IMPLANT
HOVERMATT SINGLE USE (MISCELLANEOUS) ×3 IMPLANT
KIT BASIN OR (CUSTOM PROCEDURE TRAY) ×3 IMPLANT
MARKER SKIN DUAL TIP RULER LAB (MISCELLANEOUS) ×3 IMPLANT
NEEDLE SPNL 22GX3.5 QUINCKE BK (NEEDLE) ×3 IMPLANT
PACK CARDIOVASCULAR III (CUSTOM PROCEDURE TRAY) ×3 IMPLANT
RELOAD EGIA 45 MED/THCK PURPLE (STAPLE) IMPLANT
RELOAD TRI 45 ART MED THCK BLK (STAPLE) IMPLANT
RELOAD TRI 45 ART MED THCK PUR (STAPLE) IMPLANT
RELOAD TRI 60 ART MED THCK BLK (STAPLE) ×6 IMPLANT
RELOAD TRI 60 ART MED THCK PUR (STAPLE) ×9 IMPLANT
SCISSORS LAP 5X45 EPIX DISP (ENDOMECHANICALS) ×3 IMPLANT
SET IRRIG TUBING LAPAROSCOPIC (IRRIGATION / IRRIGATOR) ×3 IMPLANT
SHEARS HARMONIC ACE PLUS 45CM (MISCELLANEOUS) ×3 IMPLANT
SLEEVE GASTRECTOMY 40FR VISIGI (MISCELLANEOUS) ×3 IMPLANT
SLEEVE XCEL OPT CAN 5 100 (ENDOMECHANICALS) ×9 IMPLANT
SOLUTION ANTI FOG 6CC (MISCELLANEOUS) ×3 IMPLANT
SPONGE LAP 18X18 X RAY DECT (DISPOSABLE) ×3 IMPLANT
STRIP CLOSURE SKIN 1/2X4 (GAUZE/BANDAGES/DRESSINGS) ×3 IMPLANT
SUT ETHIBOND 0 36 GRN (SUTURE) IMPLANT
SUT ETHILON 2 0 PS N (SUTURE) IMPLANT
SUT MNCRL AB 4-0 PS2 18 (SUTURE) ×3 IMPLANT
SUT SILK 0 SH 30 (SUTURE) IMPLANT
SUT VICRYL 0 TIES 12 18 (SUTURE) ×3 IMPLANT
SYR 20CC LL (SYRINGE) ×3 IMPLANT
SYR 50ML LL SCALE MARK (SYRINGE) ×3 IMPLANT
TOWEL OR 17X26 10 PK STRL BLUE (TOWEL DISPOSABLE) ×3 IMPLANT
TOWEL OR NON WOVEN STRL DISP B (DISPOSABLE) ×3 IMPLANT
TROCAR BLADELESS 15MM (ENDOMECHANICALS) ×3 IMPLANT
TROCAR BLADELESS OPT 5 100 (ENDOMECHANICALS) ×3 IMPLANT
TUBING CONNECTING 10 (TUBING) ×6 IMPLANT
TUBING ENDO SMARTCAP (MISCELLANEOUS) ×3 IMPLANT
TUBING INSUF HEATED (TUBING) ×3 IMPLANT

## 2016-11-07 NOTE — Discharge Instructions (Signed)

## 2016-11-07 NOTE — H&P (Signed)
Ashley [name] is an 41 y.o. female.   Chief Complaint: obesity HPI: 41 yo female with morbid obesity presents for sleeve gastrectomy  History reviewed. No pertinent past medical history.  Past Surgical History:  Procedure Laterality Date  . DILATION AND CURETTAGE OF UTERUS      History reviewed. No pertinent family history. Social History:  reports that she has quit smoking. She started smoking about 7 years ago. She has never used smokeless tobacco. She reports that she does not drink alcohol or use drugs.  Allergies: No Known Allergies  No prescriptions prior to admission.    Results for orders placed or performed during the hospital encounter of 11/07/16 (from the past 48 hour(s))  Pregnancy, urine STAT morning of surgery     Status: None   Collection Time: 11/07/16  5:33 AM  Result Value Ref Range   Preg Test, Ur NEGATIVE NEGATIVE    Comment:        THE SENSITIVITY OF THIS METHODOLOGY IS >20 mIU/mL.    No results found.  Review of Systems  Constitutional: Negative for chills and fever.  HENT: Negative for hearing loss.   Eyes: Negative for blurred vision and double vision.  Respiratory: Negative for cough and hemoptysis.   Cardiovascular: Negative for chest pain and palpitations.  Gastrointestinal: Negative for abdominal pain, nausea and vomiting.  Genitourinary: Negative for dysuria and urgency.  Musculoskeletal: Negative for myalgias and neck pain.  Skin: Negative for itching and rash.  Neurological: Negative for dizziness, tingling and headaches.  Endo/Heme/Allergies: Does not bruise/bleed easily.  Psychiatric/Behavioral: Negative for depression and suicidal ideas.    Blood pressure 121/77, pulse 93, temperature 97.1 F (36.2 C), temperature source Oral, resp. rate 18, height  (1.626 m), weight 109.3 kg (241 lb), last menstrual period 10/04/2016, SpO2 100 %. Physical Exam  Vitals reviewed. Constitutional: She is oriented to person, place, and time.  She appears well-developed and well-nourished.  HENT:  Head: Normocephalic and atraumatic.  Eyes: Conjunctivae and EOM are normal. Pupils are equal, round, and reactive to light.  Neck: Normal range of motion. Neck supple.  Cardiovascular: Normal rate and regular rhythm.   Respiratory: Effort normal and breath sounds normal.  GI: Soft. Bowel sounds are normal. She exhibits no distension. There is no tenderness.  Musculoskeletal: Normal range of motion.  Neurological: She is alert and oriented to person, place, and time.  Skin: Skin is warm and dry.  Psychiatric: She has a normal mood and affect. Her behavior is normal.     Assessment/Plan 41 yo female with morbid obesity -lap sleeve gastrectomy -ERAS bariatric protocol  [name], MD 11/07/2016, 7:11 AM

## 2016-11-07 NOTE — Anesthesia Procedure Notes (Signed)
Procedure Name: Intubation Date/Time: 11/07/2016 7:35 AM Performed by: Lissa Morales Pre-anesthesia Checklist: Patient identified, Emergency Drugs available, Suction available and Patient being monitored Patient Re-evaluated:Patient Re-evaluated prior to inductionOxygen Delivery Method: Circle system utilized Preoxygenation: Pre-oxygenation with 100% oxygen Intubation Type: IV induction Ventilation: Mask ventilation without difficulty Laryngoscope Size: Mac and 4 Grade View: Grade I Tube type: Oral Tube size: 7.0 mm Number of attempts: 1 Airway Equipment and Method: Stylet and Oral airway Placement Confirmation: ETT inserted through vocal cords under direct vision,  positive ETCO2 and breath sounds checked- equal and bilateral Secured at: 21 cm Tube secured with: Tape Dental Injury: Teeth and Oropharynx as per pre-operative assessment

## 2016-11-07 NOTE — Transfer of Care (Signed)
Immediate Anesthesia Transfer of Care Note  Patient: Ashley [name]  Procedure(s) Performed: Procedure(s): LAPAROSCOPIC GASTRIC SLEEVE RESECTION WITH UPPER ENDO (N/A) UPPER GI ENDOSCOPY  Patient Location: PACU  Anesthesia Type:General  Level of Consciousness: awake, alert , oriented and patient cooperative  Airway & Oxygen Therapy: Patient Spontanous Breathing and Patient connected to face mask oxygen  Post-op Assessment: Report given to RN, Post -op Vital signs reviewed and stable and Patient moving all extremities X 4  Post vital signs: stable  Last Vitals:  Vitals:   11/07/16 0520 11/07/16 0902  BP: 121/77 104/81  Pulse: 93 (P) 86  Resp: 18 20  Temp: 36.2 C (P) 36.4 C    Last Pain:  Vitals:   11/07/16 0520  TempSrc: Oral      Patients Stated Pain Goal: 4 (11/07/16 0547)  Complications: No apparent anesthesia complications

## 2016-11-07 NOTE — Anesthesia Postprocedure Evaluation (Addendum)
Anesthesia Post Note  Patient: Ashley [name]  Procedure(s) Performed: Procedure(s) (LRB): LAPAROSCOPIC GASTRIC SLEEVE RESECTION WITH UPPER ENDO (N/A) UPPER GI ENDOSCOPY  Patient location during evaluation: PACU Anesthesia Type: General Level of consciousness: sedated Pain management: pain level controlled Vital Signs Assessment: post-procedure vital signs reviewed and stable Respiratory status: spontaneous breathing and respiratory function stable Cardiovascular status: stable Anesthetic complications: no       Last Vitals:  Vitals:   11/07/16 0930 11/07/16 0941  BP: 115/70 110/62  Pulse: 80 82  Resp: (!) 23 (!) 21  Temp:      Last Pain:  Vitals:   11/07/16 0922  TempSrc:   PainSc: 4                  [name] DANIEL

## 2016-11-07 NOTE — Op Note (Signed)
**Note Ashley-Identified via Obfuscation** Preop Diagnosis: Obesity Class III  Postop Diagnosis: same  Procedure performed: laparoscopic Sleeve Gastrectomy  Assitant: Wenda Low  Indications:  The patient is a 41 y.o. year-old morbidly obese female who has been followed in the Bariatric Clinic as an outpatient. This patient was diagnosed with morbid obesity with a BMI of Body mass index is 41.37 kg/m. and significant co-morbidities including hypertension.  The patient was counseled extensively in the Bariatric Outpatient Clinic and after a thorough explanation of the risks and benefits of surgery (including death from complications, bowel leak, infection such as peritonitis and/or sepsis, internal hernia, bleeding, need for blood transfusion, bowel obstruction, organ failure, pulmonary embolus, deep venous thrombosis, wound infection, incisional hernia, skin breakdown, and others entailed on the consent form) and after a compliant diet and exercise program, the patient was scheduled for an elective laparoscopic sleeve gastrectomy.  Description of Operation:  Following informed consent, the patient was taken to the operating room and placed on the operating table in the supine position.  She had previously received prophylactic antibiotics and subcutaneous heparin for DVT prophylaxis in the pre-op holding area.  After induction of general endotracheal anesthesia by the anesthesiologist, the patient underwent placement of sequential compression devices, Foley catheter and an oro-gastric tube.  A timeout was confirmed by the surgery and anesthesia teams.  The patient was adequately padded at all pressure points and placed on a footboard to prevent slippage from the OR table during extremes of position during surgery.  She underwent a routine sterile prep and drape of her entire abdomen.    Next, A transverse incision was made under the left subcostal area and a 5mm optical viewing trocar was introduced into the peritoneal cavity.  Pneumoperitoneum was applied with a high flow and low pressure. A laparoscope was inserted to confirm placement. A extraperitoneal block was then placed at the lateral abdominal wall using exparel diluted with marcaine. 5 additional trocars were placed: 1 5mm trocar to the left of the midline. 1 additional 5mm trocar in the left lateral area, 1 12mm trocar in the right mid abdomen, and 1 5mm trocar in the right subcostal area.  The fat pad at the GE junction was incised and the gastrodiaphragmatic ligament was divided using the Harmonic scalpel. Next, a hole was created through the lesser omentum along the greater curve of the stomach to enter the lesser sac. The vessels along the greater omentum were  Then ligated and divided using the Harmonic scalpel moving towards the spleen and then short gastric vessels were ligated and divided in the same fashion to fully mobilize the fundus. The left crus was identified to ensure completion of the dissection. Next the antrum was measured and dissection continued inferiorly along the greater curve towards the pylorus and stopped 6cm from the pylorus.   A 40Fr ViSiGi dilator was placed into the esophgaus and along the lesser curve of the stomach and placed on suction. A 60mm 4-39mm tristapler was used to begin the resection along the antrum being sure to stay well away from the angularis by angling the jaws of the stapler towards the greater curve. An additional 60mm 4-71mm tristapler was used to continue the dissection. Then multiple 60mm 3-71mm tristapler loads were used to complete the resection staying along the ViSiGi and ensuring the fundus was not retained by appropriately retracting it lateral. Air was inserted through the ViSiGi to perform a leak test showing no bubbles and a neutral lie of the stomach.  The assistant then went  and performed an upper endoscopy and leak test. No bubbles were seen and the sleeve and antrum distended appropriately. The specimen was  then placed in an endocatch bag and removed by the 15mm port. The fascia of the 15mm port was closed with a 0 vicryl by suture passer. Hemostasis was ensured. Pneumoperitoneum was evacuated, all ports were removed and all incisions closed with 4-0 monocryl suture in subcuticular fashion. Glue was put in place for dressing. The patient awoke from anesthesia and was brought to pacu in stable condition. All counts were correct.  Estimated blood loss: <25ml  Specimens:  Sleeve gastrectomy  Local Anesthesia: 50 ml Exparel:0.5% Marcaine mix  Post-Op Plan:       Pain Management: PO, prn      Antibiotics: Prophylactic      Anticoagulation: Prophylactic, Starting now      Post Op Studies/Consults: Not applicable      Intended Discharge: within 48h      Intended Outpatient Follow-Up: Two Week      Intended Outpatient Studies: Not Applicable      Other: Not Applicable   Ashley [name]

## 2016-11-08 ENCOUNTER — Encounter (HOSPITAL_COMMUNITY): Payer: Self-pay | Admitting: General Surgery

## 2016-11-08 LAB — CBC WITH DIFFERENTIAL/PLATELET
BASOS ABS: 0 10*3/uL (ref 0.0–0.1)
Basophils Relative: 0 %
EOS ABS: 0 10*3/uL (ref 0.0–0.7)
Eosinophils Relative: 0 %
HCT: 37.3 % (ref 36.0–46.0)
Hemoglobin: 12.3 g/dL (ref 12.0–15.0)
Lymphocytes Relative: 11 %
Lymphs Abs: 1.4 10*3/uL (ref 0.7–4.0)
MCH: 27.5 pg (ref 26.0–34.0)
MCHC: 33 g/dL (ref 30.0–36.0)
MCV: 83.4 fL (ref 78.0–100.0)
Monocytes Absolute: 0.8 10*3/uL (ref 0.1–1.0)
Monocytes Relative: 6 %
Neutro Abs: 10.9 10*3/uL — ABNORMAL HIGH (ref 1.7–7.7)
Neutrophils Relative %: 83 %
Platelets: 281 10*3/uL (ref 150–400)
RBC: 4.47 MIL/uL (ref 3.87–5.11)
RDW: 15 % (ref 11.5–15.5)
WBC: 13.2 10*3/uL — ABNORMAL HIGH (ref 4.0–10.5)

## 2016-11-08 LAB — COMPREHENSIVE METABOLIC PANEL
ALT: 18 U/L (ref 14–54)
AST: 23 U/L (ref 15–41)
Albumin: 3.3 g/dL — ABNORMAL LOW (ref 3.5–5.0)
Alkaline Phosphatase: 63 U/L (ref 38–126)
Anion gap: 6 (ref 5–15)
BUN: 9 mg/dL (ref 6–20)
CHLORIDE: 104 mmol/L (ref 101–111)
CO2: 26 mmol/L (ref 22–32)
CREATININE: 0.64 mg/dL (ref 0.44–1.00)
Calcium: 8.5 mg/dL — ABNORMAL LOW (ref 8.9–10.3)
GFR calc non Af Amer: >60 mL/min (ref >60)
Glucose, Bld: 117 mg/dL — ABNORMAL HIGH (ref 65–99)
POTASSIUM: 4.2 mmol/L (ref 3.5–5.1)
SODIUM: 136 mmol/L (ref 135–145)
Total Bilirubin: 0.4 mg/dL (ref 0.3–1.2)
Total Protein: 6.6 g/dL (ref 6.5–8.1)

## 2016-11-08 NOTE — Progress Notes (Signed)
  Progress Note: Metabolic and Bariatric Surgery Service   Subjective: Minimal pain, nausea resolved  Objective: Vital signs in last 24 hours: Temp:  [97.6 F (36.4 C)-98.4 F (36.9 C)] 98.1 F (36.7 C) (04/04 0512) Pulse Rate:  [72-94] 75 (04/04 0512) Resp:  [18-26] 18 (04/04 0512) BP: (104-140)/(58-81) 123/58 (04/04 0512) SpO2:  [97 %-100 %] 99 % (04/04 0512) Weight:  [113 kg (249 lb 1.9 oz)] 113 kg (249 lb 1.9 oz) (04/04 0512)    Intake/Output from previous day: 04/03 0701 - 04/04 0700 In: 3066.3 [P.O.:720; I.V.:2346.3] Out: 2550 [Urine:2550] Intake/Output this shift: No intake/output data recorded.  Lungs: CTAB  Cardiovascular: RRR  Abd: soft, NT, ND, incisions c/d/i  Extremities: no edema  Neuro: AOx4  Lab Results: CBC   Recent Labs  11/07/16 0915 11/08/16 0447  WBC 10.4 13.2*  HGB 11.6*  11.7* 12.3  HCT 35.6*  35.4* 37.3  PLT 288 281   BMET  Recent Labs  11/07/16 0915 11/08/16 0447  NA  --  136  K  --  4.2  CL  --  104  CO2  --  26  GLUCOSE  --  117*  BUN  --  9  CREATININE 0.75 0.64  CALCIUM  --  8.5*   PT/INR No results for input(s): LABPROT, INR in the last 72 hours. ABG No results for input(s): PHART, HCO3 in the last 72 hours.  Invalid input(s): PCO2, PO2  Studies/Results:  Anti-infectives: Anti-infectives    Start     Dose/Rate Route Frequency Ordered Stop   11/07/16 0655  cefoTEtan in Dextrose 5% (CEFOTAN) 2-2.08 GM-% IVPB  Status:  Discontinued    Comments:  Anastasio Champion   : cabinet override      11/07/16 0655 11/07/16 0705   11/07/16 0654  cefoTEtan in Dextrose 5% (CEFOTAN) 2-2.08 GM-% IVPB    Comments:  Anastasio Champion   : cabinet override      11/07/16 0654 11/07/16 1859   11/07/16 0532  cefoTEtan in Dextrose 5% (CEFOTAN) IVPB 2 g     2 g Intravenous On call to O.R. 11/07/16 0532 11/07/16 0726      Medications: Scheduled Meds: . enoxaparin (LOVENOX) injection  30 mg Subcutaneous Q12H  . pantoprazole (PROTONIX) IV   40 mg Intravenous QHS  . [START ON 11/09/2016] protein supplement shake  2 oz Oral Q2H   Continuous Infusions: . sodium chloride 75 mL/hr (11/07/16 2029)   PRN Meds:.oxyCODONE **AND** acetaminophen, acetaminophen, morphine injection, ondansetron (ZOFRAN) IV  Assessment/Plan: Patient Active Problem List   Diagnosis Date Noted  . Obesity 11/07/2016  . Prediabetes 03/18/2015   s/p Procedure(s): LAPAROSCOPIC GASTRIC SLEEVE RESECTION WITH UPPER ENDO UPPER GI ENDOSCOPY 11/07/2016 -doing well, advance to shakes  Disposition:  LOS: 1 day  The patient should be discharged from the hospital today  [name], MD [phone] [facility]

## 2016-11-08 NOTE — Progress Notes (Signed)
Patient alert and oriented, pain is controlled. Patient is tolerating fluids, advanced to protein shake today, patient is tolerating well.  Reviewed Gastric sleeve discharge instructions with patient and patient is able to articulate understanding.  Provided information on BELT program, Support Group and [facility] outpatient pharmacy. All questions answered, will continue to monitor.  Rossanna Spitzley RN  

## 2016-11-08 NOTE — Discharge Summary (Signed)
Physician Discharge Summary  Ashley [name] UJW:[id number] DOB: [birth year] DOA: 11/07/2016  PCP: Ashley Reeve, MD  Admit date: 11/07/2016 Discharge date: 11/08/2016  Recommendations for Outpatient Follow-up:  1.  (include homehealth, outpatient follow-up instructions, specific recommendations for PCP to follow-up on, etc.)  Follow-up Information    Ashley Pickle, MD Follow up on 11/30/2016.   Specialty:  General Surgery Why:  POST OP FOLLOW UP APPOINTMENT AT 9:15AM  Contact information: [address] [place] [address] [phone]        Ashley Pickle, MD Follow up.   Specialty:  General Surgery Contact information: [address] [place] [place] [address] [phone]          Discharge Diagnoses:  Active Problems:   Obesity   Surgical Procedure: Laparoscopic Sleeve Gastrectomy, upper endoscopy  Discharge Condition: Good Disposition: Home  Diet recommendation: Postoperative sleeve gastrectomy diet (liquids only)  Filed Weights   11/07/16 0520 11/08/16 0512  Weight: 109.3 kg (241 lb) 113 kg (249 lb 1.9 oz)     Hospital Course:  The patient was admitted after undergoing laparoscopic sleeve gastrectomy. POD 0 she ambulated well. POD 1 she was started on the water diet protocol and tolerated 700 ml in the first shift. Once meeting the water amount she was advanced to bariatric protein shakes which they tolerated and were discharged home POD 1.  Treatments: surgery: laparoscopic sleeve gastrectomy  Discharge Instructions  Discharge Instructions    Ambulate hourly while awake    Complete by:  As directed    Call MD for:  difficulty breathing, headache or visual disturbances    Complete by:  As directed    Call MD for:  persistant dizziness or light-headedness    Complete by:  As directed    Call MD for:  persistant nausea and vomiting    Complete by:  As directed    Call MD for:  redness, tenderness, or signs of  infection (pain, swelling, redness, odor or green/yellow discharge around incision site)    Complete by:  As directed    Call MD for:  severe uncontrolled pain    Complete by:  As directed    Call MD for:  temperature >101 F    Complete by:  As directed    Diet bariatric full liquid    Complete by:  As directed    Discharge wound care:    Complete by:  As directed    Remove Bandaids tomorrow, ok to shower tomorrow. Steristrips may fall off in 1-3 weeks.   Incentive spirometry    Complete by:  As directed    Perform hourly while awake     Allergies as of 11/08/2016   No Known Allergies     Medication List    You have not been prescribed any medications.    Follow-up Information    Ashley Pickle, MD Follow up on 11/30/2016.   Specialty:  General Surgery Why:  POST OP FOLLOW UP APPOINTMENT AT 9:15AM  Contact information: [address] [place] [address] [phone]        Ashley Pickle, MD Follow up.   Specialty:  General Surgery Contact information: [address] [place] [address] [place] [address] [phone]            The results of significant diagnostics from this hospitalization (including imaging, microbiology, ancillary and laboratory) are listed below for reference.    Significant Diagnostic  Studies: No results found.  Labs: Basic Metabolic Panel:  Recent Labs Lab 11/01/16 1505 11/07/16 0915 11/08/16 0447  NA 138  --  136  K 4.0  --  4.2  CL 106  --  104  CO2 25  --  26  GLUCOSE 90  --  117*  BUN 12  --  9  CREATININE 0.81 0.75 0.64  CALCIUM 8.6*  --  8.5*   Liver Function Tests:  Recent Labs Lab 11/01/16 1505 11/08/16 0447  AST 20 23  ALT 16 18  ALKPHOS 66 63  BILITOT 0.5 0.4  PROT 7.1 6.6  ALBUMIN 3.6 3.3*    CBC:  Recent Labs Lab 11/01/16 1505 11/07/16 0915 11/08/16 0447  WBC 9.5 10.4 13.2*  NEUTROABS 5.9  --  10.9*  HGB 12.2 11.6*  11.7* 12.3  HCT 37.4 35.6*  35.4* 37.3  MCV 82.0 84.2  83.4  PLT 301 288 281    CBG: No results for input(s): GLUCAP in the last 168 hours.  Active Problems:   Obesity   Time coordinating discharge: <36min

## 2016-11-16 ENCOUNTER — Telehealth (HOSPITAL_COMMUNITY): Payer: Self-pay

## 2016-11-16 NOTE — Telephone Encounter (Signed)
Made discharge phone call to patient. Asking the following questions.    1. Do you have someone to care for you now that you are home?  idnependent 2. Are you having pain now that is not relieved by your pain medication?  No pain medication needed 3. Are you able to drink the recommended daily amount of fluids (48 ounces minimum/day) and protein (60-80 grams/day) as prescribed by the dietitian or nutritional counselor?  36 ounces of clear only 30 grams of protein.  Discussed plan to increase to another 1/2 shake 4. Are you taking the vitamins and minerals as prescribed?  Discussed switiching calcium brand for taste reasons taking multivitamin and calcium just dislikes the calcium flavor that she has purchased 5. Do you have the "on call" number to contact your surgeon if you have a problem or question?  yes 6. Are your incisions free of redness, swelling or drainage? (If steri strips, address that these can fall off, shower as tolerated) no problems 7. Have your bowels moved since your surgery?  If not, are you passing gas?  yes 8. Are you up and walking 3-4 times per day?  Walking fine 9. Were you provided your discharge medications before your surgery or before you were discharged from the hospital and are you taking them without problem?   No problems

## 2016-11-21 ENCOUNTER — Encounter: Payer: 59 | Attending: General Surgery | Admitting: Skilled Nursing Facility1

## 2016-11-21 DIAGNOSIS — Z6841 Body Mass Index (BMI) 40.0 and over, adult: Secondary | ICD-10-CM

## 2016-11-22 ENCOUNTER — Encounter: Payer: Self-pay | Admitting: Skilled Nursing Facility1

## 2016-11-22 NOTE — Progress Notes (Signed)
Bariatric Class:  Appt start time: 1530 end time:  1630.  2 Week Post-Operative Nutrition Class  Patient was seen on 11/21/2016 for Post-Operative Nutrition education at [facility].   Surgery date: 11/07/2016 Surgery type: Sleeve Gastrectomy  Start weight at [facility]: 246.4 Weight today: 226.2 Weight change: 20.2  TANITA  BODY COMP RESULTS     BMI (kg/m^2)    Fat Mass (lbs)    Fat Free Mass (lbs)    Total Body Water (lbs)    The following the learning objectives were met by the patient during this course:  Identifies Phase 3A (Soft, High Proteins) Dietary Goals and will begin from 2 weeks post-operatively to 2 months post-operatively  Identifies appropriate sources of fluids and proteins   States protein recommendations and appropriate sources post-operatively  Identifies the need for appropriate texture modifications, mastication, and bite sizes when consuming solids  Identifies appropriate multivitamin and calcium sources post-operatively  Describes the need for physical activity post-operatively and will follow MD recommendations  States when to call healthcare provider regarding medication questions or post-operative complications  Handouts given during class include:  Phase 3A: Soft, High Protein Diet Handout  Follow-Up Plan: Patient will follow-up at [facility] in 6 weeks for 2 month post-op nutrition visit for diet advancement per MD.

## 2016-11-30 DIAGNOSIS — E7800 Pure hypercholesterolemia, unspecified: Secondary | ICD-10-CM | POA: Diagnosis not present

## 2016-11-30 DIAGNOSIS — Z0000 Encounter for general adult medical examination without abnormal findings: Secondary | ICD-10-CM | POA: Diagnosis not present

## 2017-01-02 ENCOUNTER — Encounter: Payer: Self-pay | Admitting: Registered"

## 2017-01-02 ENCOUNTER — Encounter: Payer: 59 | Attending: General Surgery | Admitting: Registered"

## 2017-01-02 DIAGNOSIS — E669 Obesity, unspecified: Secondary | ICD-10-CM

## 2017-01-02 NOTE — Progress Notes (Signed)
Follow-up visit:  8 Weeks Post-Operative Sleeve Gastrectomy Surgery  Medical Nutrition Therapy:  Appt start time: 4:45 end time:  5:32.  Primary concerns today: Post-operative Bariatric Surgery Nutrition Management.  Non scale victories: feels great, clothing size is decreasing  Pt states she feels great. Pt reports she was beginning to miss vegetables and started eating them a few wks ago, experiencing some diarrhea and gas. Pt states she has tried bread and it fills her up. Pt states she is sad because she's unable to eat bread and loves bread. Pt states she couldn't handle an omelette with eggs, sausage, bacon, and/or ham. Pt reports she has been watching youtube cooking videos. Pt states she was taking gummy VitaFusion vitamins and Tums as Calcium supplements. Pt reports she doesn't like greek yogurt.  Surgery date: 11/07/2016 Surgery type: Sleeve  Start weight at [facility]: 246.4 lbs Weight today: 216.4 lbs Weight change: 9.8 lbs from previous visit (11/21/2016) Total weight lost: 30 lbs Weight loss goal: to not shop in plus sizes, weight approximately 150-160 lbs  TANITA  BODY COMP RESULTS  01/02/2017   BMI (kg/m^2) 37.1   Fat Mass (lbs) 100.0   Fat Free Mass (lbs) 116.4   Total Body Water (lbs) 84.6    Preferred Learning Style:   No preference indicated   Learning Readiness:   Ready  Change in progress  24-hr recall: B (AM): protein shake (20g) Snk (AM): chips   L (PM): 1/2 chicken leg (7g), squash, zucchini, onions, orange peppers Snk (PM): none  D (PM): roasted chicken (7g), squash, zucchini  Snk (PM): none  Fluid intake: flavored water, protein shake about 34 oz Estimated total protein intake: about 34g   Medications: See list Supplementation: Vitamin D and Tums   Using straws: no Drinking while eating: no Having you been chewing well: yes Chewing/swallowing difficulties: no Changes in vision: no Changes to mood/headaches: no Hair loss/Changes to  skin/Changes to nails: no Any difficulty focusing or concentrating: no Sweating: no Dizziness/Lightheaded: no Palpitations: no  Carbonated beverages: no N/V/D/C/GAS: diarrhea with certain vegetables; gas when eating certain vegetables Abdominal Pain: no Dumping syndrome: no Last Lap-Band fill: N/A  Recent physical activity:  Walking 2-3x/wk, stretching, yoga  Progress Towards Goal(s):  In progress.  Handouts given during visit include:  Snack Ideas for post-surgery  Vitamin and Mineral Recommendations    Nutritional Diagnosis:  NB-1.4 Self-monitoring deficit As related to daily protein and fluid requirements.  As evidenced by 24 hr dietary recall of less than 60g protein and less than 64oz of fluid .    Intervention:  Nutrition education and counseling on the importance of protein and fluid requirements. Pt was also advised to always eat protein first, followed by non-starchy vegetables. Pt was encouraged to incorporate protein snacks to increase protein intake as well as to limit meals times to 20-30 minutes. Pt was advised on bariatric specific vitamins that will meet her needs.   Goals:  Follow Phase 3B: High Protein + Non-Starchy Vegetables  Eat 3-6 small meals/snacks, every 3-5 hrs  Increase lean protein foods to meet 60g goal  Increase fluid intake to 64oz +  Avoid drinking 15 minutes before, during and 30 minutes after eating  Aim for >30 min of physical activity daily  Teaching Method Utilized:  Visual Auditory Hands on  Barriers to learning/adherence to lifestyle change: none  Demonstrated degree of understanding via:  Teach Back   Monitoring/Evaluation:  Dietary intake, exercise, lap band fills, and body weight. Follow up in  2 months for 4 month post-op visit.

## 2017-01-02 NOTE — Patient Instructions (Signed)
-   Increase protein intake to at least 60g

## 2017-01-06 NOTE — Addendum Note (Signed)
Addendum  created 01/06/17 0810 by Raphael Espe, MD   Sign clinical note    

## 2017-03-05 ENCOUNTER — Encounter: Payer: Self-pay | Admitting: Registered"

## 2017-03-05 ENCOUNTER — Encounter: Payer: 59 | Attending: General Surgery | Admitting: Registered"

## 2017-03-05 DIAGNOSIS — E669 Obesity, unspecified: Secondary | ICD-10-CM

## 2017-03-05 NOTE — Patient Instructions (Addendum)
-   Track food and drink intake with MyFitnessPal  to help monitor daily protein and fluid needs.  - Aim for at least 60 grams of protein.  - Aim to eat protein source at least 3-4 times a day.  - Aim for at least 64 ounces of fluid a day.  - Get bariatric-specific vitamins. Try Bariatric Advantage Advanced Multi EA.  - Take 3 Calcium supplements per day.   - Read nutrition facts labels to make sure that fat and sugar are in single digits per serving.

## 2017-03-05 NOTE — Progress Notes (Signed)
Follow-up visit:  8 Weeks Post-Operative Sleeve Gastrectomy Surgery  Medical Nutrition Therapy:  Appt start time: 4:45 end time:  5:45  Primary concerns today: Post-operative Bariatric Surgery Nutrition Management.  Non scale victories: feels great, clothing size is decreasing   Pt's name is pronounced "Ashley [name]". Pt states she has had some issues with constipation and reduced cheese intake. Pt states she has increased her water intake. Pt is not meeting protein or fluid needs. Pt is also not getting the proper amount of vitamin and minerals, according to self-report of daily vitamin regimen.   Pt states she feels great. Pt reports she was beginning to miss vegetables and started eating them a few wks ago, experiencing some diarrhea and gas. Pt states she has tried bread and it fills her up. Pt states she is sad because she's unable to eat bread and loves bread. Pt states she couldn't handle an omelette with eggs, sausage, bacon, and/or ham. Pt reports she has been watching youtube cooking videos. Pt states she was taking gummy VitaFusion vitamins and Tums as Calcium supplements. Pt reports she doesn't like greek yogurt.   Surgery date: 11/07/2016 Surgery type: Sleeve  [facility]: [facility] lbs Weight today: 198.0 lbs Weight change: 18.4 lbs from previous visit (01/02/2017) Total weight lost: 48.4 lbs Weight loss goal: to not shop in plus sizes, weight approximately 150-160 lbs  TANITA  BODY COMP RESULTS  01/02/2017 03/05/2017   BMI (kg/m^2) 37.1 34.0   Fat Mass (lbs) 100.0 83.8   Fat Free Mass (lbs) 116.4 114.2   Total Body Water (lbs) 84.6 82.2    Preferred Learning Style:   No preference indicated   Learning Readiness:   Ready  Change in progress  24-hr recall: B (AM): chicken wing (6g) Snk (AM): chips   L (PM): 2 drummettes (14g)  Snk (PM): nuts (12g) D (PM): salad or roasted chicken (14g), squash, zucchini  Snk (PM): none  Fluid intake: flavored water,  protein shake (sometimes); about 40 oz total Estimated total protein intake: about 46g   Medications: See list Supplementation: VitaFusion, Vitamin D and 2 Tums   Using straws: no Drinking while eating: no Having you been chewing well: yes and fast Chewing/swallowing difficulties: no Changes in vision: no Changes to mood/headaches: no Hair loss/Changes to skin/Changes to nails: no, no, no Any difficulty focusing or concentrating: no Sweating: no Dizziness/Lightheaded: no Palpitations: no  Carbonated beverages: no N/V/D/C/GAS: no, no, no, yes, no Abdominal Pain: no Dumping syndrome: no Last Lap-Band fill: N/A  Recent physical activity:  Walking 2-3x/wk, stretching, yoga  Progress Towards Goal(s):  In progress.  Handouts given during visit include:  Snack Ideas for post-surgery  Vitamin and Mineral Recommendations    Nutritional Diagnosis:  NB-1.4 Self-monitoring deficit As related to daily protein and fluid requirements.  As evidenced by 24 hr dietary recall of less than 60g protein and less than 64oz of fluid .    Intervention:  Nutrition education and counseling on the importance of protein and fluid requirements. Pt was also advised to always eat protein first, followed by non-starchy vegetables. Pt was encouraged to incorporate protein snacks to increase protein intake. Pt was advised on bariatric specific vitamins that will meet her needs.   Goals: - Track food and drink intake with MyFitnessPal  to help monitor daily protein and fluid needs. - Aim for at least 60 grams of protein. - Aim to eat protein source at least 3-4 times a day. - Aim for at least  64 ounces of fluid a day. - Get bariatric-specific vitamins. Try Bariatric Advantage Advanced Multi EA. - Take 3 Calcium supplements per day.  - Read nutrition facts labels to make sure that fat and sugar are in single digits per serving.   Teaching Method Utilized:  Visual Auditory Hands on  Barriers to  learning/adherence to lifestyle change: none  Demonstrated degree of understanding via:  Teach Back   Monitoring/Evaluation:  Dietary intake, exercise, lap band fills, and body weight. Follow up in 2 weeks for 19 week post-op visit.

## 2017-03-07 DIAGNOSIS — E559 Vitamin D deficiency, unspecified: Secondary | ICD-10-CM | POA: Diagnosis not present

## 2017-03-19 ENCOUNTER — Encounter: Payer: Self-pay | Admitting: Registered"

## 2017-03-19 ENCOUNTER — Encounter: Payer: 59 | Attending: General Surgery | Admitting: Registered"

## 2017-03-19 DIAGNOSIS — E669 Obesity, unspecified: Secondary | ICD-10-CM

## 2017-03-19 NOTE — Patient Instructions (Addendum)
-   Incorporate 1/2 protein shake into daily routine to help consistently meet 60g protein.   - Continue to track food and drink intake using MyFitnessPal.  - Continue to aim for at least 64 oz fluid a day.

## 2017-03-19 NOTE — Progress Notes (Signed)
Follow-up visit:  4 Months Post-Operative Sleeve Gastrectomy Surgery  Medical Nutrition Therapy:  Appt start time: 4:45 end time:  5:30  Primary concerns today: Post-operative Bariatric Surgery Nutrition Management.  Non scale victories: feels great, clothing size is decreasing   Pt's name is pronounced "Chand-dra". Pt states she has been multivitamins and calcium supplements. Pt states it helps when she drinks from bigger bottles of water; getting closer to 64 oz fluid a day. Pt states she has started tracking food using MyFitnessPal and it helps. Pt states she no longer has constipation.   Pt reports she doesn't like greek yogurt.   Surgery date: 11/07/2016 Surgery type: Sleeve  [facility] Weight today: 195.6.0 lbs Weight change: 18.4 lbs from previous visit (01/02/2017) Total weight lost: 48.4 lbs Weight loss goal: to not shop in plus sizes, weight approximately 150-160 lbs  TANITA  BODY COMP RESULTS  01/02/2017 03/05/2017   BMI (kg/m^2) 37.1 34.0   Fat Mass (lbs) 100.0 83.8   Fat Free Mass (lbs) 116.4 114.2   Total Body Water (lbs) 84.6 82.2    Preferred Learning Style:   No preference indicated   Learning Readiness:   Ready  Change in progress  24-hr recall: B (AM): pancake w/ syrup, bacon (3g), egg (6g) Snk (AM): cheese stick (6g) L (PM): Doritos, 3" ham and cheese sub (15g) Snk (PM): a piece of hot dog, barbecue chips D (PM): popcorn shrimp (7g), fish (7g) Snk (PM): egg (6g), grits, 1 slice of bacon (3g)  Fluid intake: flavored water, protein shake (sometimes); about 55 oz total Estimated total protein intake:  ~62g   Medications: See list Supplementation: Yes, MVI + 3 Ca   Using straws: no Drinking while eating: no Having you been chewing well: yes  Chewing/swallowing difficulties: no Changes in vision: no Changes to mood/headaches: no Hair loss/Changes to skin/Changes to nails: no, no, no Any difficulty focusing or  concentrating: no Sweating: no Dizziness/Lightheaded: no Palpitations: no  Carbonated beverages: no N/V/D/C/GAS: no, no, no, no, no Abdominal Pain: no Dumping syndrome: no Last Lap-Band fill: N/A  Recent physical activity:  Walking 2-3x/wk, stretching, yoga  Progress Towards Goal(s):  In progress.  Handouts given during visit include:  Snack Ideas for post-surgery  Vitamin and Mineral Recommendations    Nutritional Diagnosis:  NB-1.4 Self-monitoring deficit As related to daily protein and fluid requirements.  As evidenced by 24 hr dietary recall of less than 60g protein and less than 64oz of fluid .    Intervention:  Nutrition education and counseling on importance of continuing to increase fluid intake. Pt was encouraged to continue to incorporate protein snacks to increase protein intake.   Goals: - Incorporate 1/2 protein shake into daily routine to help consistently meet 60g protein.  - Continue to track food and drink intake using MyFitnessPal. - Continue to aim for at least 64 oz fluid a day.   Teaching Method Utilized:  Visual Auditory Hands on  Barriers to learning/adherence to lifestyle change: none  Demonstrated degree of understanding via:  Teach Back   Monitoring/Evaluation:  Dietary intake, exercise, lap band fills, and body weight. Follow up in 2 months for 6 month post-op visit.

## 2017-05-02 DIAGNOSIS — Z9884 Bariatric surgery status: Secondary | ICD-10-CM | POA: Diagnosis not present

## 2017-05-28 ENCOUNTER — Encounter: Payer: 59 | Attending: General Surgery | Admitting: Registered"

## 2017-05-28 ENCOUNTER — Encounter: Payer: Self-pay | Admitting: Registered"

## 2017-05-28 DIAGNOSIS — E669 Obesity, unspecified: Secondary | ICD-10-CM

## 2017-05-28 NOTE — Patient Instructions (Addendum)
-   Track food and fluid using Baritastic App.  - Try Nature's Twist, sugar-free jello, sugar-free popsicles, etc to increase water intake.   - Aim to consume at least 64 oz of fluid daily.   - Try ProCare multivitamin and possible order online.

## 2017-05-28 NOTE — Progress Notes (Signed)
Follow-up visit:  4 Months Post-Operative Sleeve Gastrectomy Surgery  Medical Nutrition Therapy:  Appt start time: 4:50 end time:  5:30  Primary concerns today: Post-operative Bariatric Surgery Nutrition Management.  Non scale victories: feels great, clothing size is decreasing, wears a size M in shirts from size 2XL, and wears size 12 pants  Surgery date: 11/07/2016 Surgery type: Sleeve  Start weight at [facility]: 246.4 lbs Weight today: 181.0 lbs Weight change: 14.6 lbs from 195.6 lbs on (03/05/2017) Total weight lost: 63 lbs Weight loss goal: to not shop in plus sizes, weight approximately 150-160 lbs  TANITA  BODY COMP RESULTS  01/02/2017 03/05/2017 05/28/2017   BMI (kg/m^2) 37.1 34.0 31.1   Fat Mass (lbs) 100.0 83.8 71.2   Fat Free Mass (lbs) 116.4 114.2 109.8   Total Body Water (lbs) 84.6 82.2 78.4    Pt's name is pronounced "Chand-dra". Pt states she has run out of Celebrate multivitamins and will not get another supply until she gets paid next week. Pt states she still takes her calcium supplements. Pt states she she is still struggling getting 64 oz of fluid a day.   Pt reports she doesn't like greek yogurt.  Preferred Learning Style:   No preference indicated   Learning Readiness:   Ready  Change in progress  24-hr recall: B (AM): kielbasa, onions, peppers (14g) Snk (AM): peanuts (7g), chicken leg (21g) L (PM): 1-2 oz orange chicken (7-14g) and rice  Snk (PM): protein pack (15g) D (PM): chicken leg, mac and cheese, cabbage or greens Snk (PM): none  Fluid intake: flavored water (2-3 bottles); about 34-51 oz total Estimated total protein intake:  60+ g   Medications: See list Supplementation: Yes, MVI + 3 Ca   Using straws: no Drinking while eating: no Having you been chewing well: yes  Chewing/swallowing difficulties: no Changes in vision: no Changes to mood/headaches: no Hair loss/Changes to skin/Changes to nails: no, no, no Any difficulty focusing or  concentrating: no Sweating: no Dizziness/Lightheaded: no Palpitations: no  Carbonated beverages: no N/V/D/C/GAS: no, no, no, no, no Abdominal Pain: no Dumping syndrome: no Last Lap-Band fill: N/A  Recent physical activity:  Walking 20-30 min, 2-3x/week; walking dog 5x/week, stretching, yoga  Progress Towards Goal(s):  In progress.  Handouts given during visit include:  Snack Ideas for post-surgery  Vitamin and Mineral Recommendations    Nutritional Diagnosis:  NB-1.4 Self-monitoring deficit As related to daily fluid requirements.  As evidenced by 24 hr dietary recall of less than 64oz of fluid .    Intervention:  Nutrition education and counseling on importance of continuing to increase fluid intake. Pt was educated and counseled on how to utilize the [employer] App and set reminders to drink fluids throughout her day. Pt was given ideas on other fluid options to help increase fluid intake.  Goals: - Track food and fluid using Baritastic App. - Try Nature's Twist, sugar-free jello, sugar-free popsicles, etc to increase water intake.  - Aim to consume at least 64 oz of fluid daily.  - Try ProCare multivitamin and possible order online.   Teaching Method Utilized:  Visual Auditory Hands on  Barriers to learning/adherence to lifestyle change: contemplative stage of change  Demonstrated degree of understanding via:  Teach Back   Monitoring/Evaluation:  Dietary intake, exercise, lap band fills, and body weight. Follow up in 1 months for 7 month post-op visit.

## 2017-06-08 ENCOUNTER — Telehealth: Payer: Self-pay | Admitting: Skilled Nursing Facility1

## 2017-06-08 NOTE — Telephone Encounter (Signed)
Pt asked if she could do capsules.  Dietitian answered yes.

## 2017-07-05 ENCOUNTER — Encounter: Payer: Self-pay | Admitting: Registered"

## 2017-07-05 ENCOUNTER — Encounter: Payer: 59 | Attending: General Surgery | Admitting: Registered"

## 2017-07-05 DIAGNOSIS — E669 Obesity, unspecified: Secondary | ICD-10-CM

## 2017-07-05 NOTE — Patient Instructions (Signed)
-   Continue to increase fluid intake to at least 64 oz daily.   - Keep up the great up! You're doing great!

## 2017-07-05 NOTE — Progress Notes (Signed)
Follow-up visit: 7 Months Post-Operative Sleeve Gastrectomy Surgery  Medical Nutrition Therapy:  Appt start time: 4:11 end time:  4:39  Primary concerns today: Post-operative Bariatric Surgery Nutrition Management.  Non scale victories: feels great, clothing size is decreasing, wears a size M in shirts from size 2XL, and wears size 12 pants  Surgery date: 11/07/2016 Surgery type: Sleeve  [facility]: 246.4 lbs Weight today: 177.4 lbs Weight change: 3.6 lbs from 181.0 lbs on (05/28/2017) Total weight lost: 69 lbs Weight loss goal: to not shop in plus sizes, weight approximately 150-160 lbs  TANITA  BODY COMP RESULTS  01/02/2017 03/05/2017 05/28/2017 07/05/2017   BMI (kg/m^2) 37.1 34.0 31.1 30.5   Fat Mass (lbs) 100.0 83.8 71.2 68.2   Fat Free Mass (lbs) 116.4 114.2 109.8 109.2   Total Body Water (lbs) 84.6 82.2 78.4 78.0    Pt's name is pronounced "Chand-dra". Pt states she is still working to increase fluid intake; consistently drinking 3 bottles (16.9 ounces each) a day. Pt states she walks on treadmill a few times a week during lunch break for 30 min. Pt is doing well with behavioral changes and working towards her goals.   Pt states she she is still struggling getting 64 oz of fluid a day.   Pt reports she doesn't like greek yogurt.  Preferred Learning Style:   No preference indicated   Learning Readiness:   Ready  Change in progress  24-hr recall: B (AM): [place]-1/2 ham biscuit (7.5g) or protein shake (30g) or kielbasa, onions, peppers (14g) Snk (AM): [place]-1/2 ham biscuit (7.5g) L (PM): 2 slabs of ribs (14g) Snk (PM): kettle corn popcorn D (PM): nachos, 2 oz beef (14g), jalapeno, cheese (7g), protein pack (12g), cupcake  Snk (PM): sweet potato chips  Fluid intake: flavored water (3 bottles); about 51 oz total Estimated total protein intake: 60+ g   Medications: See list Supplementation: Yes, [facility] capsule MVI + 3 Ca  Using  straws: no Drinking while eating: no Having you been chewing well: yes  Chewing/swallowing difficulties: no Changes in vision: no Changes to mood/headaches: no Hair loss/Changes to skin/Changes to nails: no, no, no Any difficulty focusing or concentrating: no Sweating: no Dizziness/Lightheaded: no Palpitations: no  Carbonated beverages: no N/V/D/C/GAS: no, no, no, sometimes, no Abdominal Pain: no Dumping syndrome: no Last Lap-Band fill: N/A  Recent physical activity:  Walking 20-30 min, 2-3x/week;  Progress Towards Goal(s):  In progress.  Handouts given during visit include:  none   Nutritional Diagnosis:  NB-1.4 Self-monitoring deficit As related to daily fluid requirements.  As evidenced by 24 hr dietary recall of less than 64oz of fluid .    Intervention:  Nutrition education and counseling on importance of continuing to increase fluid intake. Goals: - Continue to increase fluid intake to at least 64 oz daily.  - Keep up the great up! You're doing great!  Teaching Method Utilized:  Visual Auditory Hands on  Barriers to learning/adherence to lifestyle change:  Action stage of change  Demonstrated degree of understanding via:  Teach Back   Monitoring/Evaluation:  Dietary intake, exercise, lap band fills, and body weight. Follow up in 5 months for 12 month post-op visit.

## 2017-11-29 ENCOUNTER — Encounter: Payer: Self-pay | Admitting: Registered"

## 2017-11-29 ENCOUNTER — Encounter: Payer: 59 | Attending: General Surgery | Admitting: Registered"

## 2017-11-29 DIAGNOSIS — Z9884 Bariatric surgery status: Secondary | ICD-10-CM | POA: Insufficient documentation

## 2017-11-29 DIAGNOSIS — Z713 Dietary counseling and surveillance: Secondary | ICD-10-CM | POA: Insufficient documentation

## 2017-11-29 DIAGNOSIS — E669 Obesity, unspecified: Secondary | ICD-10-CM

## 2017-11-29 NOTE — Patient Instructions (Addendum)
-   Continue to increase fluid intake after completing 50-ounce bottle with sugar-free jello, water, etc.   - Aim to eat what you can in 20-30 minutes and put the remainder of food away to return to it later.   - Continue eating well-balanced meals, including protein, non-starchy vegetables, and carbohydrates (grits, rice, etc.)  - Increase physical activity with resistance bands/strength-training 1-2 times a week.   - Check into attending support group. See handout.

## 2017-11-29 NOTE — Progress Notes (Addendum)
Follow-up visit: 12 Months Post-Operative Sleeve Gastrectomy Surgery  Medical Nutrition Therapy:  Appt start time: 4:45 end time:  5:20  Primary concerns today: Post-operative Bariatric Surgery Nutrition Management.  Non scale victories: feels great, clothing size is decreasing, wears a size M in shirts from size 2XL, and wears size 12 pants  Surgery date: 11/07/2016 Surgery type: Sleeve  [facility] Weight today: Pt declined Weight change: N/A from 177.4 lbs on (07/05/2017) Total weight lost: 69 lbs from 07/05/2017 Weight loss goal: to not shop in plus sizes, weight approximately 150-160 lbs  TANITA  BODY COMP RESULTS  01/02/2017 03/05/2017 05/28/2017 07/05/2017 11/29/2017   BMI (kg/m^2) 37.1 34.0 31.1 30.5 Pt declined   Fat Mass (lbs) 100.0 83.8 71.2 68.2    Fat Free Mass (lbs) 116.4 114.2 109.8 109.2    Total Body Water (lbs) 84.6 82.2 78.4 78.0     Pt's name is pronounced "Chand-dra". Pt states she is still working to increase fluid intake; consistently drinking 3 bottles (16.9 ounces each) a day. Pt states she feels good. Pt states she does not want to gain any ounces. Pt states she may be changing jobs soon which will allow her to have more physical activity while working. Pt states she is enjoying this new journey. Pt states she tried yoga and did not like it; plans to incorporate resistance bands. Pt states she she is still struggling getting 64 oz of fluid a day. Pt reports she doesn't like greek yogurt.  Preferred Learning Style:   No preference indicated   Learning Readiness:   Ready  Change in progress  24-hr recall: B (AM): [place]-1-2 oz ham (7-14g), egg (6g), 1/2 c grits Snk (AM): 3-4 graham crackers L (PM):  chicken casserole (chicken, cream of chicken, rice, broccoli, cheese-14g), pineapple slices Snk (PM): 6 shrimp (14g) D (PM): 5 drummettes (21g)   Snk (PM): none  Fluid intake: flavored water (3 bottles); about 51 oz  total Estimated total protein intake: 60+ g   Medications: See list Supplementation: Yes, [facility] capsule MVI + 3 Ca  Using straws: no Drinking while eating: no Having you been chewing well: yes  Chewing/swallowing difficulties: no Changes in vision: no Changes to mood/headaches: no Hair loss/Changes to skin/Changes to nails: no, no, no Any difficulty focusing or concentrating: no Sweating: no Dizziness/Lightheaded: no Palpitations: no  Carbonated beverages: no N/V/D/C/GAS: no, no, no, sometimes, no Abdominal Pain: no Dumping syndrome: no Last Lap-Band fill: N/A  Recent physical activity:  Walking 20-30 min, at least 3 times/week   Progress Towards Goal(s):  In progress.  Handouts given during visit include:  none   Nutritional Diagnosis:  NB-1.4 Self-monitoring deficit As related to daily fluid requirements.  As evidenced by 24 hr dietary recall of less than 64oz of fluid .    Intervention:  Nutrition education and counseling on importance of continuing to increase fluid intake, attending support group, limiting meals to 20-30 minutes, eating well-balanced meals, and increasing physical activity.   Goals: - Continue to increase fluid intake after completing 50-ounce bottle with sugar-free jello, water, etc.  - Aim to eat what you can in 20-30 minutes and put the remainder of food away to return to it later.  - Continue eating well-balanced meals, including protein, non-starchy vegetables, and carbohydrates (grits, rice, etc.) - Increase physical activity with resistance bands/strength-training 1-2 times a week.  - Check into attending support group. See handout.  Teaching Method Utilized:  Visual Auditory Hands on  Barriers to learning/adherence to lifestyle change:  Action stage of change  Demonstrated degree of understanding via:  Teach Back   Monitoring/Evaluation:  Dietary intake, exercise, lap band fills, and body weight. Follow up in 5 months for 17  month post-op visit.

## 2017-12-19 DIAGNOSIS — K912 Postsurgical malabsorption, not elsewhere classified: Secondary | ICD-10-CM | POA: Diagnosis not present

## 2017-12-19 DIAGNOSIS — Z9884 Bariatric surgery status: Secondary | ICD-10-CM | POA: Diagnosis not present

## 2017-12-27 DIAGNOSIS — Z0000 Encounter for general adult medical examination without abnormal findings: Secondary | ICD-10-CM | POA: Diagnosis not present

## 2017-12-27 DIAGNOSIS — E559 Vitamin D deficiency, unspecified: Secondary | ICD-10-CM | POA: Diagnosis not present

## 2017-12-27 DIAGNOSIS — Z9884 Bariatric surgery status: Secondary | ICD-10-CM | POA: Diagnosis not present

## 2017-12-27 DIAGNOSIS — E7800 Pure hypercholesterolemia, unspecified: Secondary | ICD-10-CM | POA: Diagnosis not present

## 2018-03-05 DIAGNOSIS — Z0100 Encounter for examination of eyes and vision without abnormal findings: Secondary | ICD-10-CM | POA: Diagnosis not present

## 2018-05-02 ENCOUNTER — Ambulatory Visit: Payer: 59 | Admitting: Registered"

## 2018-09-05 IMAGING — RF DG UGI W/ KUB
7 of 11 series · 7 of 14 positions shown · non-contrast
Comparison: None.

CLINICAL DATA: Preoperative assessment for gastric sleeve surgery.

EXAM:
UPPER GI SERIES WITH KUB
TECHNIQUE: After obtaining a scout radiograph a routine upper GI series was
performed using thin and high density barium.
FLUOROSCOPY TIME:  Fluoroscopy Time:  1.5 minutes
Radiation Exposure Index (if provided by the fluoroscopic device):
36.9 mGy
Number of Acquired Spot Images: 4

[Series 1: t abdomen supine · 0.15mm/px · 1 of 1 slices shown]
[im 1/1]
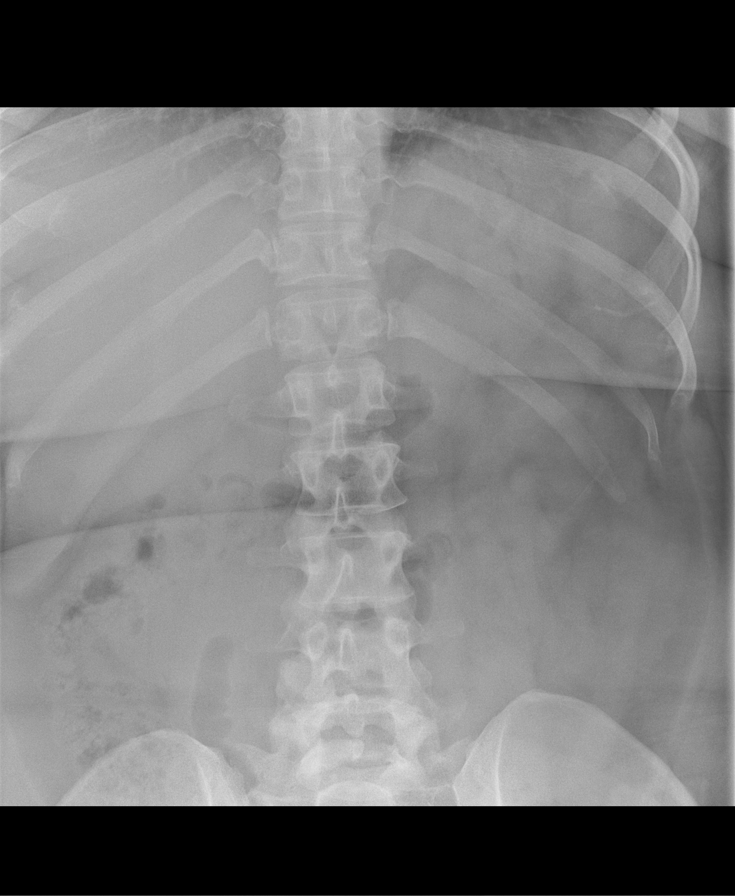

[Series 3: cp_standard · 0.18mm/px · 1 of 1 slices shown (1 of 3)]
[im 1/1]
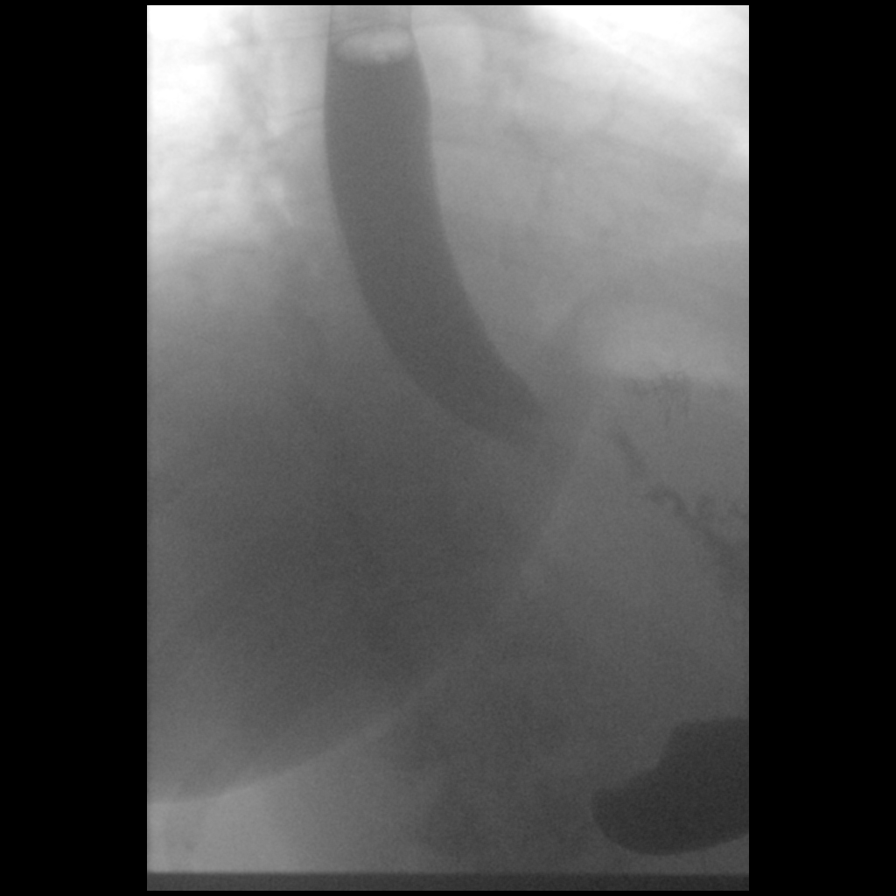

[Series 4: cp_standard · 0.18mm/px · 1 of 1 slices shown (2 of 3)]
[im 1/1]
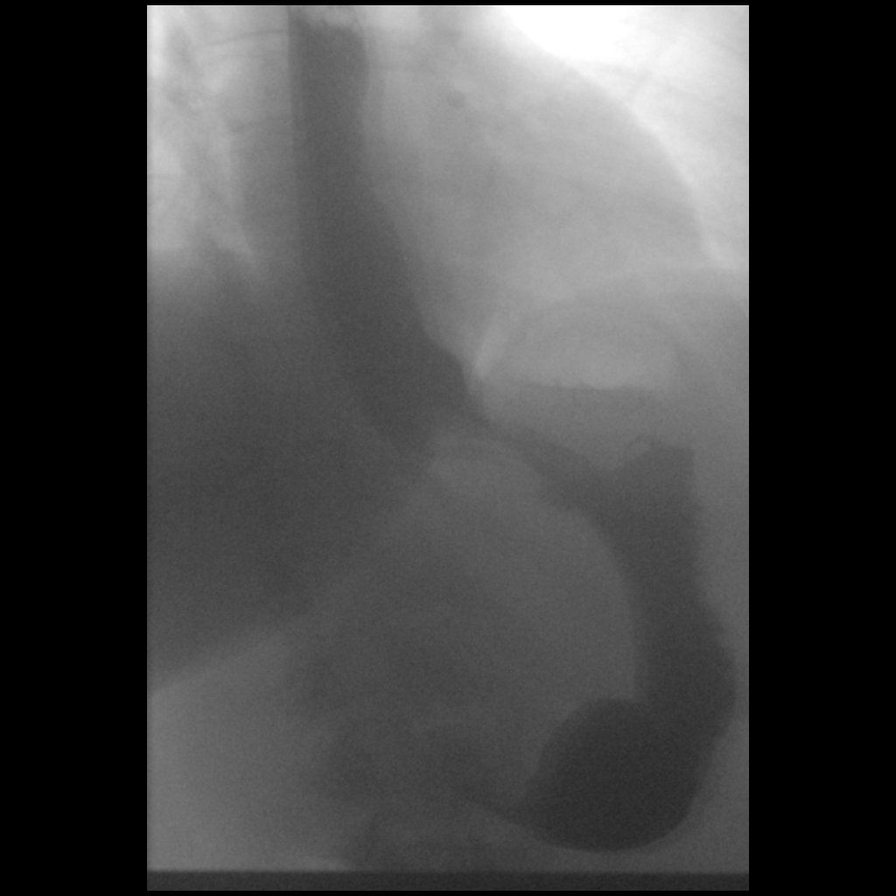

[Series 5: cp_standard · 0.18mm/px · 1 of 1 slices shown (3 of 3)]
[im 1/1]
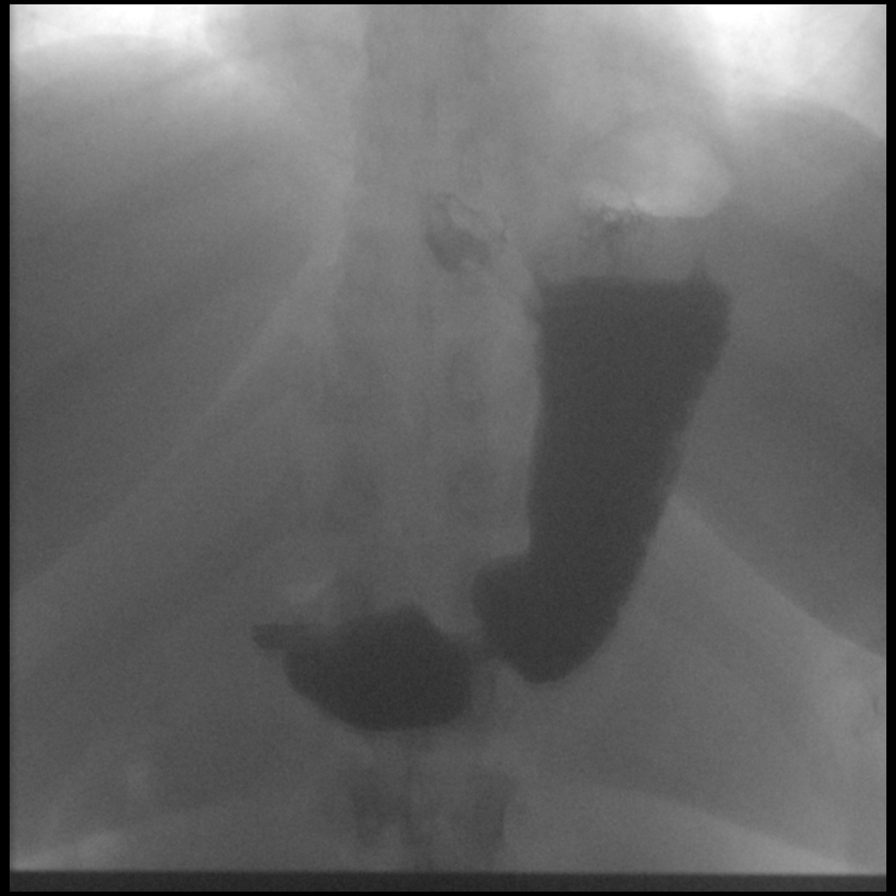

[Series 6: fluoro_barium 2fps_bw · 0.18mm/px · 1 of 1 slices shown (1 of 3)]
[im 1/1]
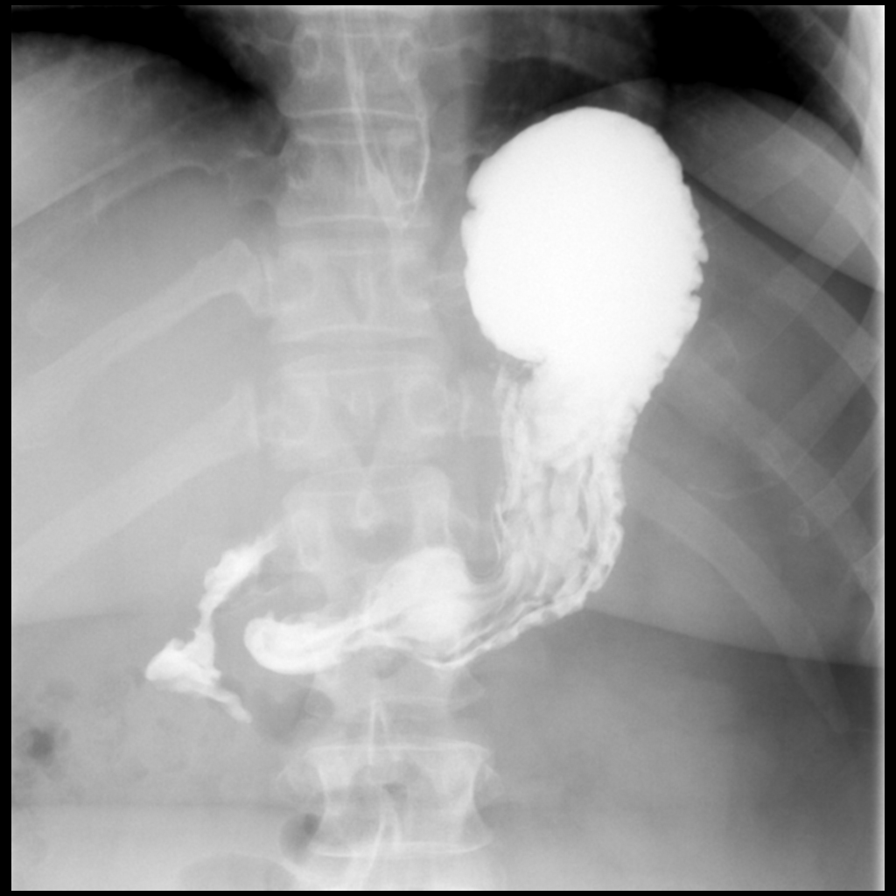

[Series 7: fluoro_barium 2fps_bw · 0.18mm/px · 1 of 1 slices shown (2 of 3)]
[im 1/1]
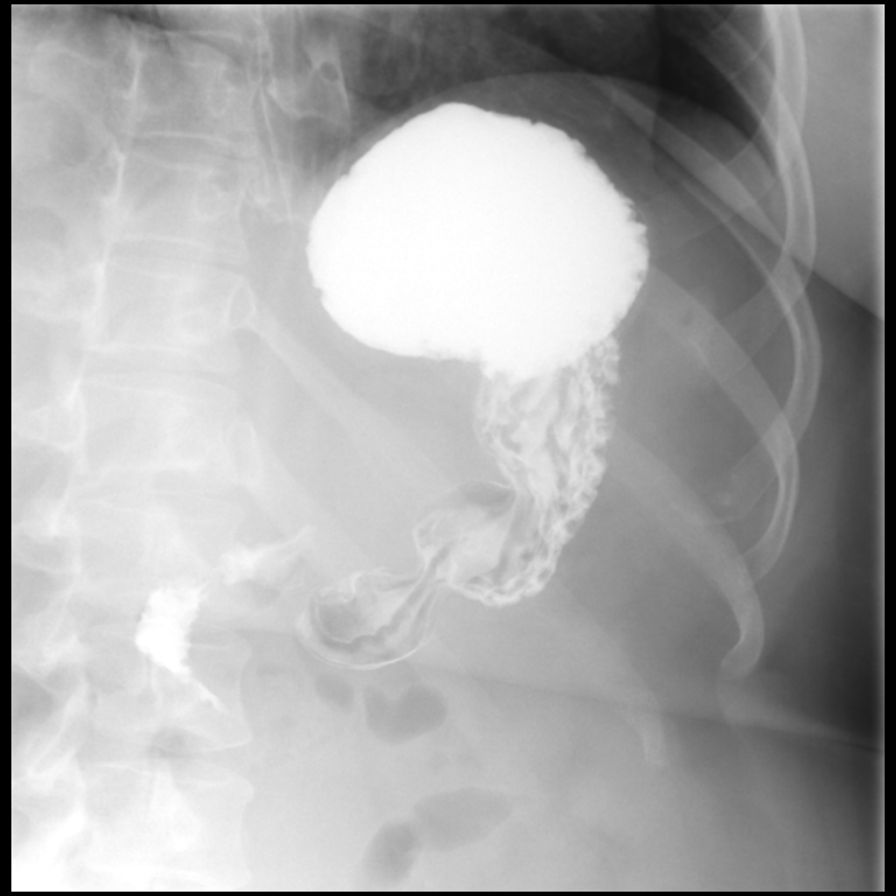

[Series 8: fluoro_barium 2fps_bw · 0.18mm/px · 1 of 1 slices shown (3 of 3)]
[im 1/1]
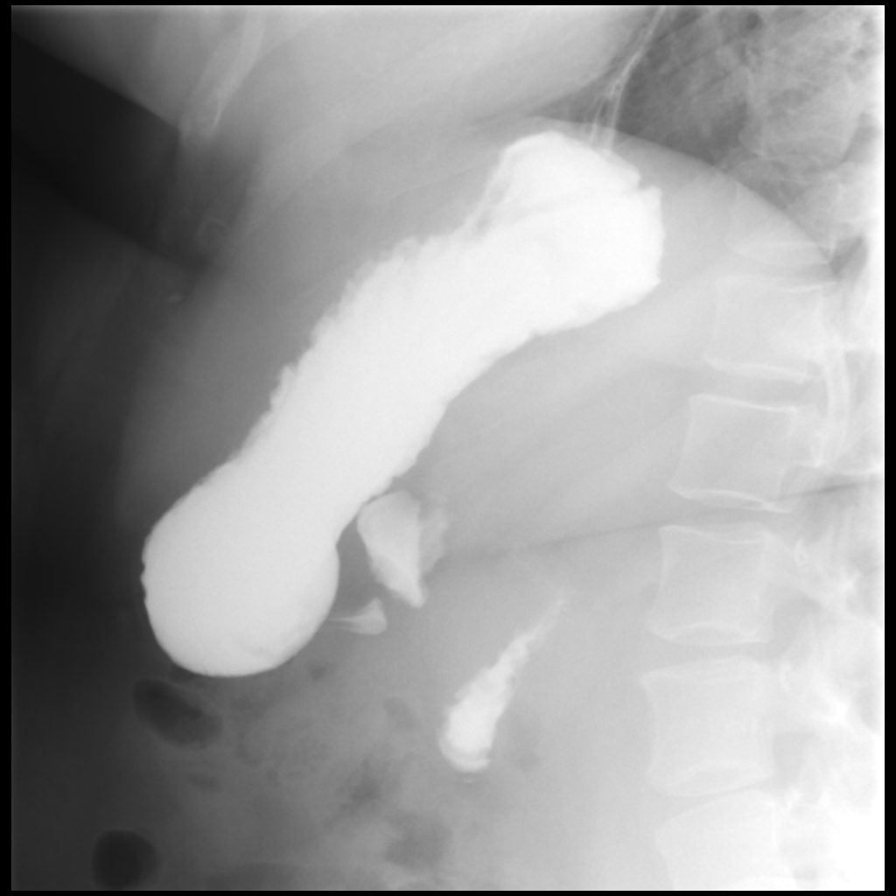

[7 of 14 positions shown; findings below may reference images not displayed]

FINDINGS: KUB shows normal bowel gas pattern with no concerning mass effect or
calcification. The visualized lung bases are clear.

Oblique pharyngeal imaging is normal with no aspiration or
obstructive process.

Normal distensibility and contour of the esophagus. Normal
esophageal motility. No hiatal hernia or definite gastroesophageal
reflux.

Normal stomach shape and distensibility. Normal fold pattern where
not obscured by contrast. Normal duodenal C-loop anatomy and fold
pattern.
IMPRESSION: Normal upper GI.

## 2018-09-05 IMAGING — CR DG CHEST 2V
2 series · 2 of 2 positions shown · non-contrast
Comparison: None.

CLINICAL DATA: Preop morbid obesity.  Cough, congestion for 3 weeks

EXAM:
CHEST  2 VIEW

[w chest pa]
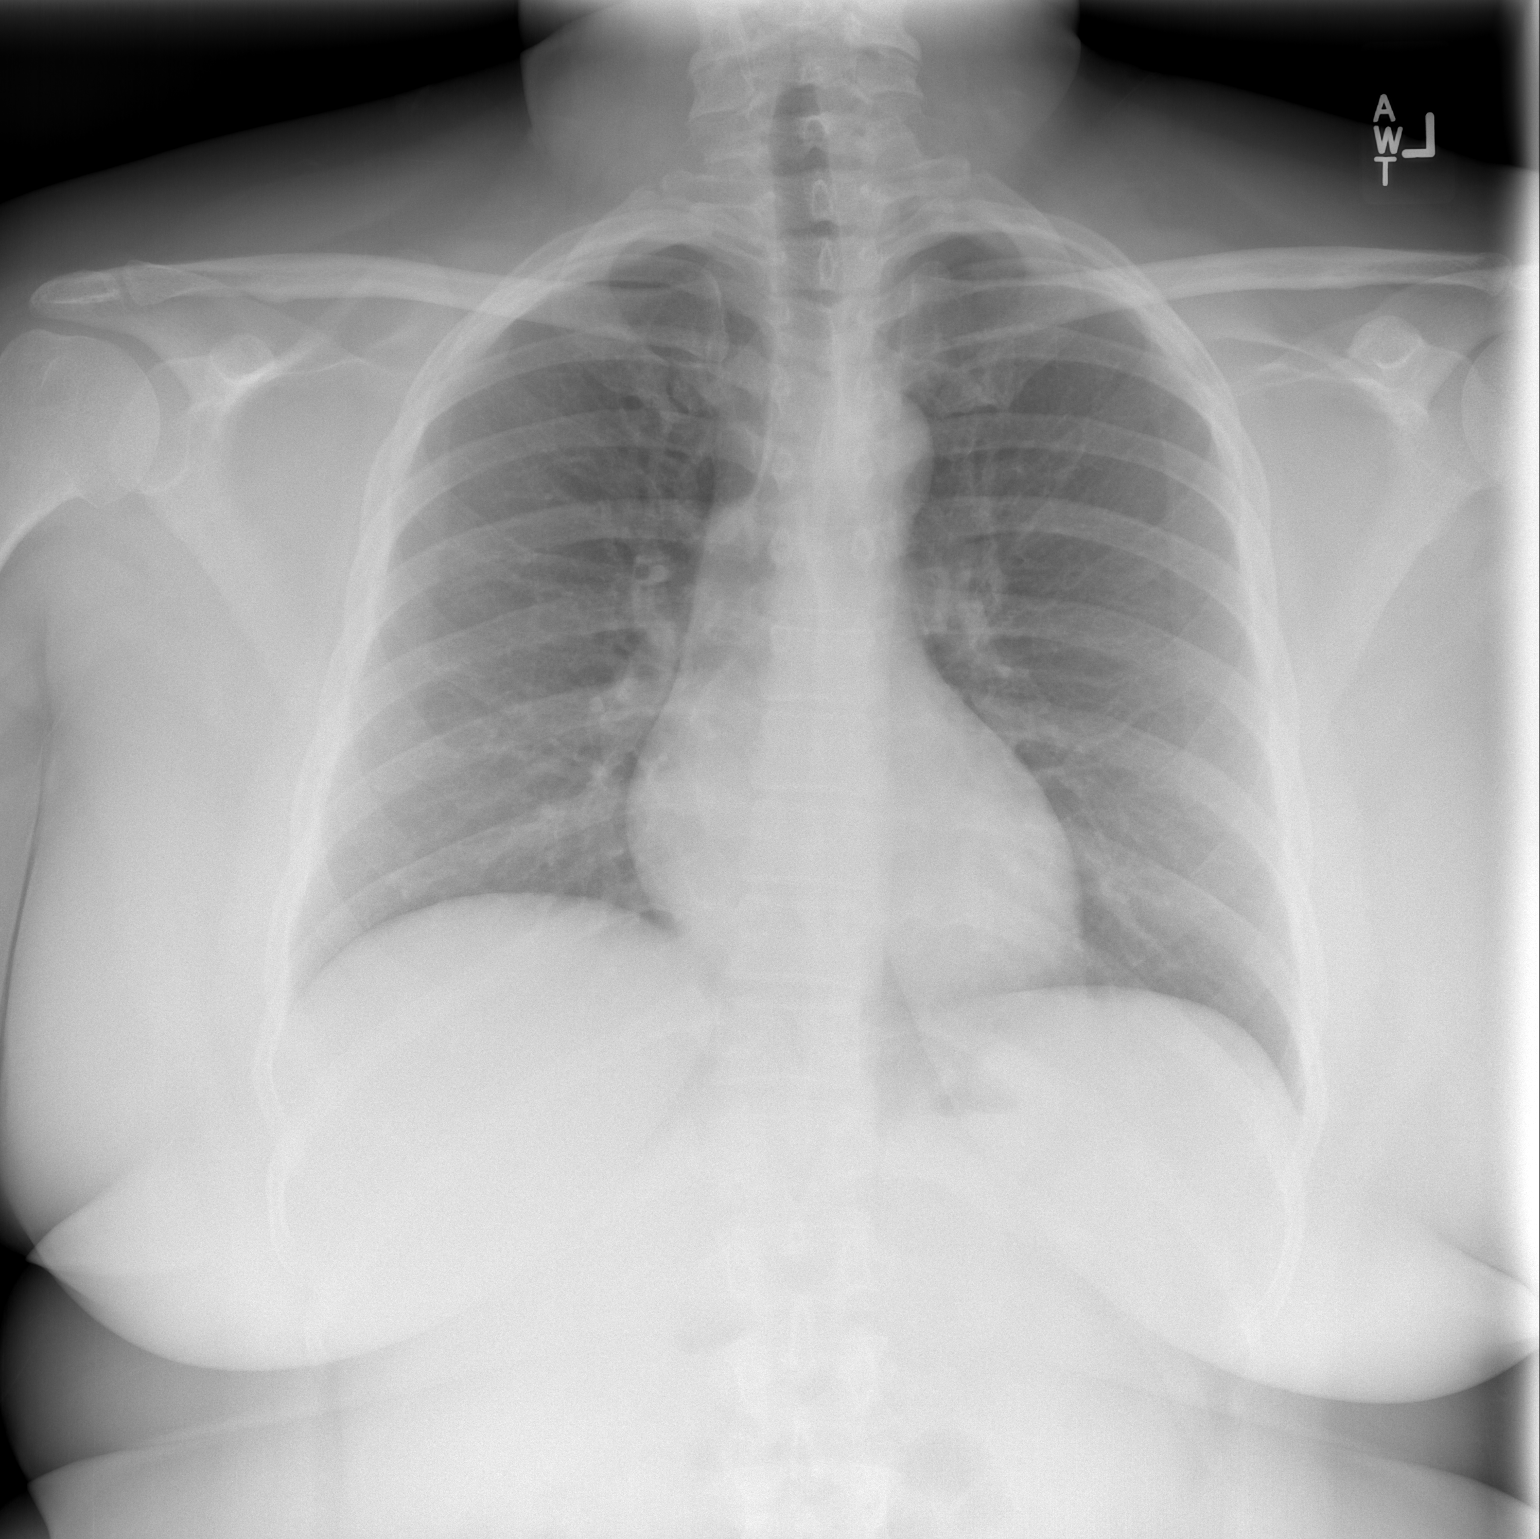

[w chest lat]
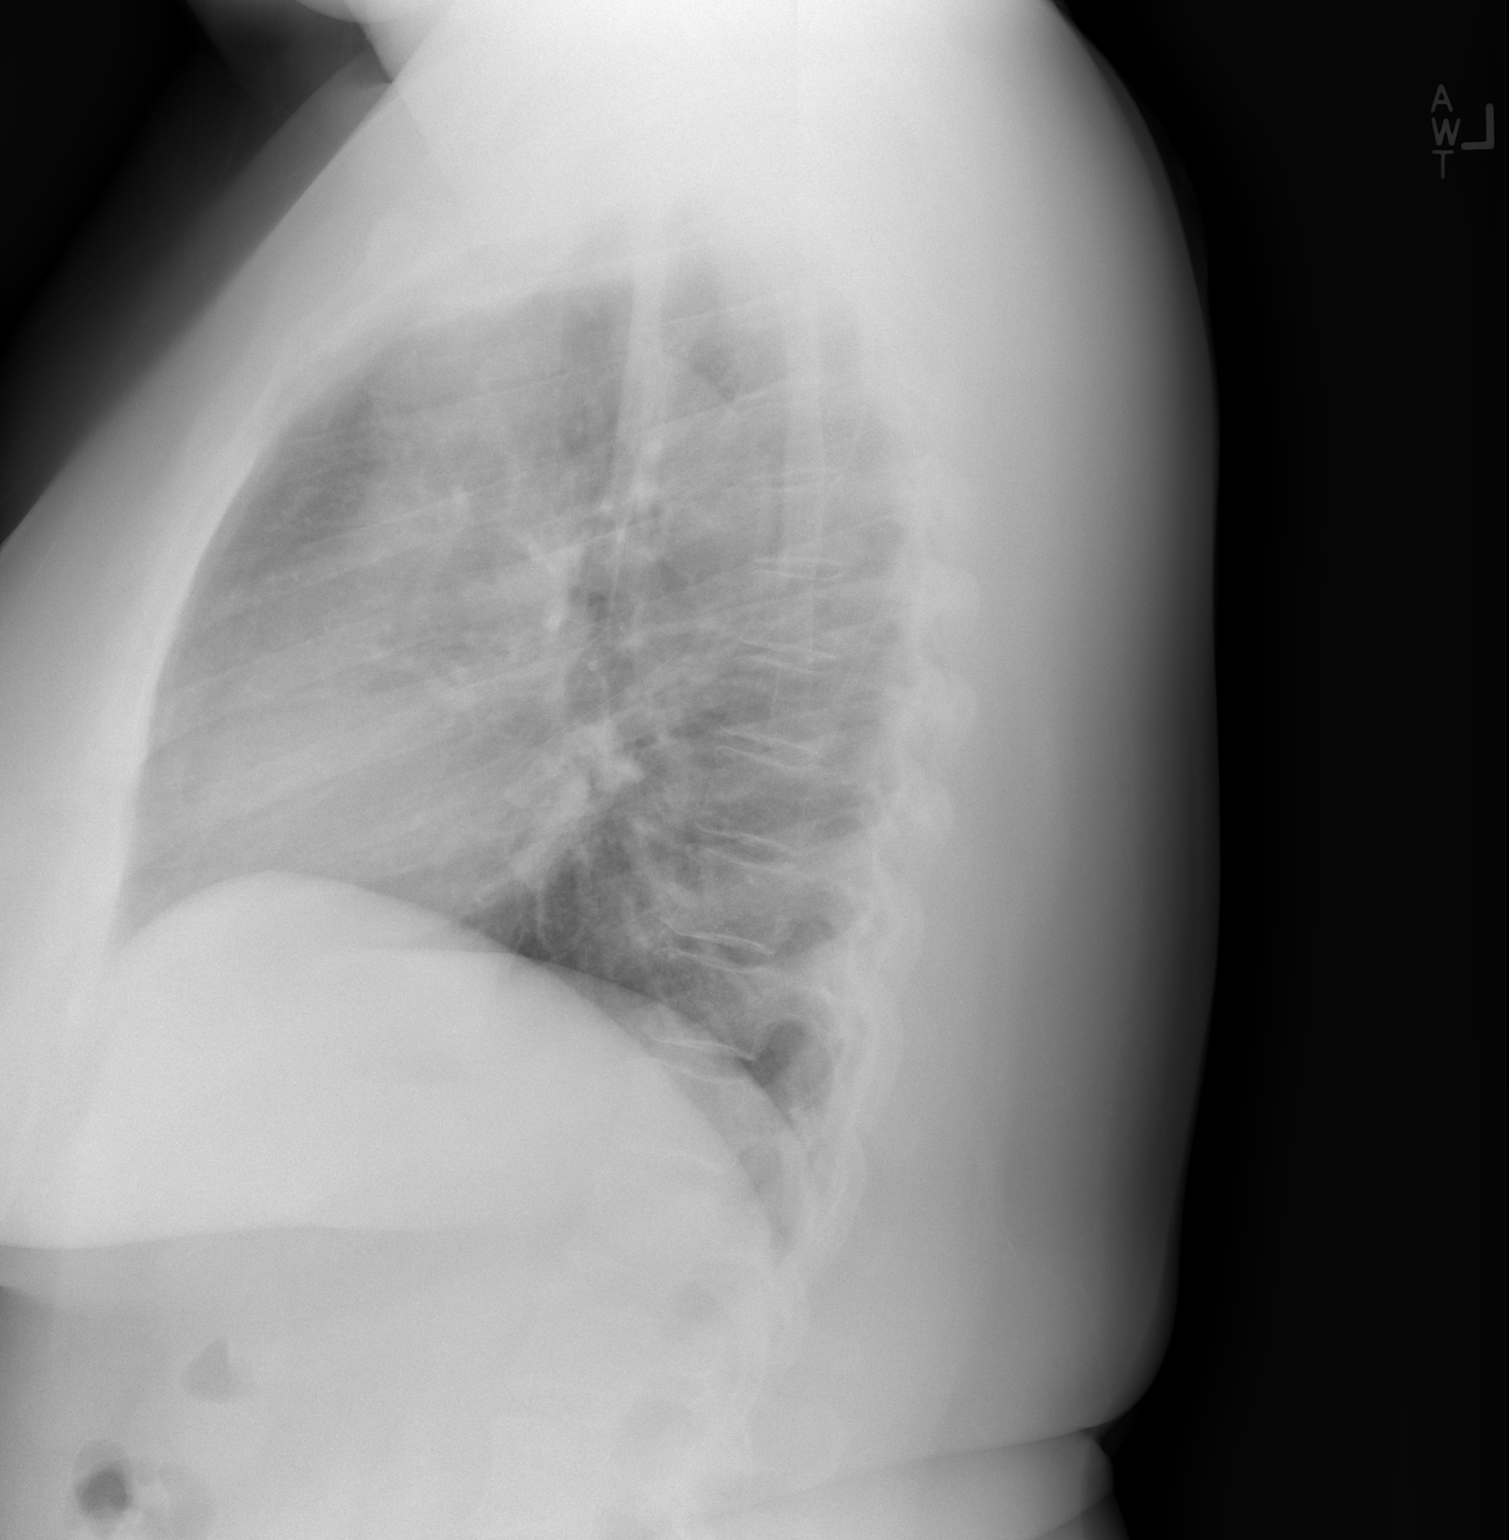

[2 of 2 positions shown; findings below may reference images not displayed]

FINDINGS: Heart and mediastinal contours are within normal limits. No focal
opacities or effusions. No acute bony abnormality.
IMPRESSION: No active cardiopulmonary disease.

## 2018-12-18 DIAGNOSIS — E669 Obesity, unspecified: Secondary | ICD-10-CM | POA: Diagnosis not present

## 2018-12-18 DIAGNOSIS — K912 Postsurgical malabsorption, not elsewhere classified: Secondary | ICD-10-CM | POA: Diagnosis not present

## 2018-12-18 DIAGNOSIS — Z9884 Bariatric surgery status: Secondary | ICD-10-CM | POA: Diagnosis not present

## 2020-02-03 ENCOUNTER — Ambulatory Visit
Admission: RE | Admit: 2020-02-03 | Discharge: 2020-02-03 | Disposition: A | Discharge status: Home / Self Care | Payer: 59 | Source: Ambulatory Visit | Attending: Emergency Medicine | Admitting: Emergency Medicine

## 2020-02-03 ENCOUNTER — Other Ambulatory Visit: Payer: Self-pay

## 2020-02-03 VITALS — BP 118/81 | HR 73 | Temp 98.4°F | Resp 16

## 2020-02-03 DIAGNOSIS — N760 Acute vaginitis: Secondary | ICD-10-CM | POA: Insufficient documentation

## 2020-02-03 DIAGNOSIS — Z7251 High risk heterosexual behavior: Secondary | ICD-10-CM

## 2020-02-03 DIAGNOSIS — B9689 Other specified bacterial agents as the cause of diseases classified elsewhere: Secondary | ICD-10-CM | POA: Diagnosis present

## 2020-02-03 LAB — POCT URINALYSIS DIP (MANUAL ENTRY)
Bilirubin, UA: NEGATIVE
Glucose, UA: NEGATIVE mg/dL
Ketones, POC UA: NEGATIVE mg/dL
Nitrite, UA: NEGATIVE
Protein Ur, POC: NEGATIVE mg/dL
Spec Grav, UA: 1.025 (ref 1.010–1.025)
Urobilinogen, UA: 0.2 E.U./dL
pH, UA: 5.5 (ref 5.0–8.0)

## 2020-02-03 LAB — POCT URINE PREGNANCY: Preg Test, Ur: NEGATIVE

## 2020-02-03 MED ORDER — FLUCONAZOLE 200 MG PO TABS: 200.0000 mg | ORAL_TABLET | Freq: Once | ORAL | 0 refills | Status: AC

## 2020-02-03 MED ORDER — METRONIDAZOLE 500 MG PO TABS: 500.0000 mg | ORAL_TABLET | Freq: Two times a day (BID) | ORAL | 0 refills | Status: DC

## 2020-02-03 NOTE — Discharge Instructions (Signed)
Today you received treatment for BV. °Testing for chlamydia, gonorrhea, trichomonas is pending: please look for these results on the MyChart app/website.  We will notify you if you are positive and outline treatment at that time. ° °Important to avoid all forms of sexual intercourse (oral, vaginal, anal) with any/all partners for the next 7 days to avoid spreading/reinfecting. °Any/all sexual partners should be notified of testing/treatment today. ° °Return for persistent/worsening symptoms or if you develop fever, abdominal or pelvic pain, blood in your urine, or are re-exposed to an STI. °

## 2020-02-03 NOTE — ED Triage Notes (Signed)
Pt concerned about bacterial vaginosis, had clear vaginal discharge and odor, pt taking AZO at home with improvement of discharge

## 2020-02-03 NOTE — ED Provider Notes (Signed)
[facility]    CSN: [id number] Arrival date & time: 02/03/20  7371      History   Chief Complaint Chief Complaint  Patient presents with  . Vaginal Discharge    HPI Ashley [name] is a 44 y.o. female history of obesity, prediabetes presenting for vaginal discharge, malodor.  States she tried Azo without relief.  Does endorse history of BV: States this feels similar.  Denying vaginal or pelvic pain, pruritus, abdominal or back pain, fever.  No urinary symptoms such as frequency, urgency.   History reviewed. No pertinent past medical history.  Patient Active Problem List   Diagnosis Date Noted  . Obesity 11/07/2016  . Prediabetes 03/18/2015    Past Surgical History:  Procedure Laterality Date  . DILATION AND CURETTAGE OF UTERUS    . LAPAROSCOPIC GASTRIC SLEEVE RESECTION N/A 11/07/2016   Procedure: LAPAROSCOPIC GASTRIC SLEEVE RESECTION WITH UPPER ENDO;  Surgeon: De Blanch Kinsinger, MD;  Location: [facility];  Service: General;  Laterality: N/A;  . UPPER GI ENDOSCOPY  11/07/2016   Procedure: UPPER GI ENDOSCOPY;  Surgeon: De Blanch Kinsinger, MD;  Location: [facility];  Service: General;;    OB History    Gravida  4   Para  2   Term  2   Preterm      AB  2   Living  2     SAB  2   TAB      Ectopic      Multiple      Live Births  2            Home Medications    Prior to Admission medications   Medication Sig Start Date End Date Taking? Authorizing Provider  ACYCLOVIR PO Take by mouth.   Yes [provider]  fluconazole (DIFLUCAN) 200 MG tablet Take 1 tablet (200 mg total) by mouth once for 1 dose. May repeat in 72 hours if needed 02/03/20 02/03/20  Hall-Potvin, [place], PA-C  metroNIDAZOLE (FLAGYL) 500 MG tablet Take 1 tablet (500 mg total) by mouth 2 (two) times daily. 02/03/20   Hall-Potvin, [place], PA-C    Family History History reviewed. No pertinent family history.  Social History Social History   Tobacco Use  .  Smoking status: Former Smoker    Start date: 09/17/2009  . Smokeless tobacco: Never Used  Substance Use Topics  . Alcohol use: No  . Drug use: No     Allergies   Patient has no known allergies.   Review of Systems As per HPI   Physical Exam Triage Vital Signs ED Triage Vitals [02/03/20 0951]  Enc Vitals Group     BP 118/81     Pulse Rate 73     Resp 16     Temp 98.4 F (36.9 C)     Temp Source Oral     SpO2 98 %     Weight      Height      Head Circumference      Peak Flow      Pain Score      Pain Loc      Pain Edu?      Excl. in GC?    No data found.  Updated Vital Signs BP 118/81 (BP Location: Left Arm)   Pulse 73   Temp 98.4 F (36.9 C) (Oral)   Resp 16   SpO2 98%   Visual Acuity Right Eye Distance:   Left Eye Distance:  Bilateral Distance:    Right Eye Near:   Left Eye Near:    Bilateral Near:     Physical Exam Constitutional:      General: She is not in acute distress. HENT:     Head: Normocephalic and atraumatic.  Eyes:     General: No scleral icterus.    Pupils: Pupils are equal, round, and reactive to light.  Cardiovascular:     Rate and Rhythm: Normal rate.  Pulmonary:     Effort: Pulmonary effort is normal.  Abdominal:     General: Bowel sounds are normal.     Palpations: Abdomen is soft.     Tenderness: There is no abdominal tenderness. There is no right CVA tenderness, left CVA tenderness or guarding.  Genitourinary:    Comments: Patient declined, self-swab performed Skin:    Coloration: Skin is not jaundiced or pale.  Neurological:     Mental Status: She is alert and oriented to person, place, and time.      UC Treatments / Results  Labs (all labs ordered are listed, but only abnormal results are displayed) Labs Reviewed  POCT URINALYSIS DIP (MANUAL ENTRY) - Abnormal; Notable for the following components:      Result Value   Blood, UA trace-intact (*)    Leukocytes, UA Small (1+) (*)    All other components within  normal limits  URINE CULTURE  POCT URINE PREGNANCY  CERVICOVAGINAL ANCILLARY ONLY    EKG   Radiology No results found.  Procedures Procedures (including critical care time)  Medications Ordered in UC Medications - No data to display  Initial Impression / Assessment and Plan / UC Course  I have reviewed the triage vital signs and the nursing notes.  Pertinent labs & imaging results that were available during my care of the patient were reviewed by me and considered in my medical decision making (see chart for details).     Patient appears well in office today.  Urine dipstick done prior to my evaluation: Small leukocytes with trace intact blood: Culture pending.  Not having urinary symptoms so will await culture prior to treatment.  Patient does have history of BV and states this feels similar: Will start treatment today.  Cytology pending: We will treat for concurrent infection if indicated.  Return precautions discussed, patient verbalized understanding and is agreeable to plan. Final Clinical Impressions(s) / UC Diagnoses   Final diagnoses:  Unprotected sex  BV (bacterial vaginosis)     Discharge Instructions     Today you received treatment for BV. Testing for chlamydia, gonorrhea, trichomonas is pending: please look for these results on the MyChart app/website.  We will notify you if you are positive and outline treatment at that time.  Important to avoid all forms of sexual intercourse (oral, vaginal, anal) with any/all partners for the next 7 days to avoid spreading/reinfecting. Any/all sexual partners should be notified of testing/treatment today.  Return for persistent/worsening symptoms or if you develop fever, abdominal or pelvic pain, blood in your urine, or are re-exposed to an STI.    ED Prescriptions    Medication Sig Dispense Auth. Provider   metroNIDAZOLE (FLAGYL) 500 MG tablet Take 1 tablet (500 mg total) by mouth 2 (two) times daily. 14 tablet  Hall-Potvin, [place], PA-C   fluconazole (DIFLUCAN) 200 MG tablet Take 1 tablet (200 mg total) by mouth once for 1 dose. May repeat in 72 hours if needed 2 tablet Hall-Potvin, [place], PA-C     PDMP not  reviewed this encounter.   Hall-Potvin, [place], [place] 02/03/20 1104

## 2020-02-04 LAB — URINE CULTURE

## 2020-02-04 LAB — CERVICOVAGINAL ANCILLARY ONLY
Chlamydia: NEGATIVE
Comment: NEGATIVE
Comment: NEGATIVE
Comment: NORMAL
Neisseria Gonorrhea: NEGATIVE
Trichomonas: POSITIVE — AB

## 2020-06-02 ENCOUNTER — Encounter (HOSPITAL_COMMUNITY): Payer: Self-pay

## 2020-08-13 ENCOUNTER — Ambulatory Visit: Payer: 59 | Attending: Internal Medicine

## 2020-08-13 DIAGNOSIS — Z23 Encounter for immunization: Secondary | ICD-10-CM

## 2020-08-13 NOTE — Progress Notes (Signed)
   Covid-19 Vaccination Clinic  Name:  Ashley [name]    MRN: [id number] DOB: 1975/09/27  08/13/2020  [name] was observed post Covid-19 immunization for 15 minutes without incident. She was provided with Vaccine Information Sheet and instruction to access the V-Safe system.   [name] was instructed to call 911 with any severe reactions post vaccine: Marland Kitchen Difficulty breathing  . Swelling of face and throat  . A fast heartbeat  . A bad rash all over body  . Dizziness and weakness   Immunizations Administered    Name Date Dose VIS Date Route   Moderna Covid-19 Booster Vaccine 08/13/2020  5:11 PM 0.25 mL 05/26/2020 Intramuscular   Manufacturer: Gala Murdoch   Lot: [id number]   NDC: 38250-539-76

## 2021-04-13 ENCOUNTER — Other Ambulatory Visit: Payer: Self-pay

## 2021-04-13 ENCOUNTER — Ambulatory Visit (INDEPENDENT_AMBULATORY_CARE_PROVIDER_SITE_OTHER): Payer: 59 | Admitting: Family Medicine

## 2021-04-13 ENCOUNTER — Encounter (INDEPENDENT_AMBULATORY_CARE_PROVIDER_SITE_OTHER): Payer: Self-pay | Admitting: Family Medicine

## 2021-04-13 VITALS — BP 95/62 | HR 77 | Temp 98.3°F | Ht 64.0 in | Wt 176.0 lb

## 2021-04-13 DIAGNOSIS — E669 Obesity, unspecified: Secondary | ICD-10-CM

## 2021-04-13 DIAGNOSIS — R7303 Prediabetes: Secondary | ICD-10-CM

## 2021-04-13 DIAGNOSIS — E559 Vitamin D deficiency, unspecified: Secondary | ICD-10-CM

## 2021-04-13 DIAGNOSIS — E7849 Other hyperlipidemia: Secondary | ICD-10-CM

## 2021-04-13 DIAGNOSIS — Z9189 Other specified personal risk factors, not elsewhere classified: Secondary | ICD-10-CM

## 2021-04-13 DIAGNOSIS — Z0289 Encounter for other administrative examinations: Secondary | ICD-10-CM

## 2021-04-13 DIAGNOSIS — R0602 Shortness of breath: Secondary | ICD-10-CM | POA: Diagnosis not present

## 2021-04-13 DIAGNOSIS — R5383 Other fatigue: Secondary | ICD-10-CM | POA: Diagnosis not present

## 2021-04-13 DIAGNOSIS — Z6830 Body mass index (BMI) 30.0-30.9, adult: Secondary | ICD-10-CM

## 2021-04-13 DIAGNOSIS — Z1331 Encounter for screening for depression: Secondary | ICD-10-CM | POA: Diagnosis not present

## 2021-04-13 NOTE — Progress Notes (Signed)
Dear Dr. Paulino [name],   Thank you for referring Ashley [name] to our clinic. The following note includes my evaluation and treatment recommendations.  Chief Complaint:   OBESITY Ashley [name] (MR# [id number]) is a 45 y.o. female who presents for evaluation and treatment of obesity and related comorbidities. Current BMI is Body mass index is 30.21 kg/m. Ashley [name] has been struggling with her weight for many years and has been unsuccessful in either losing weight, maintaining weight loss, or reaching her healthy weight goal.  Ashley [name] is currently in the action stage of change and ready to dedicate time achieving and maintaining a healthier weight. Ashley [name] is interested in becoming our patient and working on intensive lifestyle modifications including (but not limited to) diet and exercise for weight loss.  Gastric Sleeve in 2018. 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 Breakfast- 1 cup Frosted Flakes with whole milk or 1 egg + 1/2 grits + 1 sausage (feel full); 2 hours later- snack on chips or baked snap-peas (not sure if she is hungry or thirsty); 12:30 Lunch- leftovers- 1 chicken leg + 1/2 potato or salad with chicken (feel full); 5:30- 1 small rib + 1/2 cup potatoes + 1/3 candid yams + vegetables; Snack- cheese stick in PM.  Ashley [name]'s habits were reviewed today and are as follows: she thinks her family will eat healthier with her, her desired weight loss is 16-26 lbs, she has been heavy most of her life, she started gaining weight after childbirth, her heaviest weight ever was 248 pounds, she is a picky eater and doesn't like to eat healthier foods, she has significant food cravings issues, she snacks frequently in the evenings, she is frequently drinking liquids with calories, she frequently makes poor food choices, and she struggles with emotional eating.  Depression Screen Ashley [name] Food and Mood (modified PHQ-9) score was 6.  Depression screen Ashley [name] 2/9  [name]  Decreased Interest 0  Down, Depressed, Hopeless 1  PHQ - 2 Score 1  Altered sleeping 0  Tired, decreased energy 1  Change in appetite 3  Feeling bad or failure about yourself  0  Trouble concentrating 1  Moving slowly or fidgety/restless 0  Suicidal thoughts 0  PHQ-9 Score 6  Difficult doing work/chores Not difficult at all   Subjective:   1. Other fatigue Ashley [name] admits to daytime somnolence and admits to waking up still tired. Patent has a history of symptoms of daytime fatigue and morning fatigue. Ashley [name] generally gets 6 hours of sleep per night, and states that she has generally restful sleep. Snoring "I don't think so" present. Apneic episodes are not present. Epworth Sleepiness Score is 11. EKG normal sinus rhythm at 74 bpm.  2. SOB (shortness of breath) Ashley [name] notes increasing shortness of breath with exercising and seems to be worsening over time with weight gain. She notes getting out of breath sooner with activity than she used to. This has gotten worse recently. [name] denies shortness of breath at rest or orthopnea. EKG normal sinus rhythm at 74 bpm.  3. Vitamin D deficiency Ashley [name] takes OTC Vit D daily. Her last Vit D level was 26.1.  4. Pre-diabetes Pt's last A1c was 5.5 and insulin level 7.2. She is not on medication.  5. Other hyperlipidemia Her last LDL was 154, HDL 75, triglycerides 64, and total 240. She is not on statin therapy.  6. At risk for deficient intake of food The patient is at a higher than average risk of  deficient intake of food due to gastric sleeve.  Assessment/Plan:   1. Other fatigue [name] does feel that her weight is causing her energy to be lower than it should be. Fatigue may be related to obesity, depression or many other causes. Labs will be ordered, and in the meanwhile, Ashley [name] will focus on self care including making healthy food choices, increasing physical activity and focusing on stress reduction. Check labs  today.  - EKG 12-Lead - Folate - T3 - T4, free  2. SOB (shortness of breath) Ashley [name] does feel that she gets out of breath more easily that she used to when she exercises. Ashley [name]'s shortness of breath appears to be obesity related and exercise induced. She has agreed to work on weight loss and gradually increase exercise to treat her exercise induced shortness of breath. Will continue to monitor closely.  3. Vitamin D deficiency We will discuss at next appt. Low Vitamin D level contributes to fatigue and are associated with obesity, breast, and colon cancer. She agrees to follow-up for routine testing of Vitamin D, at least 2-3 times per year to avoid over-replacement.  4. Pre-diabetes Ashley [name] will continue to work on weight loss, exercise, and decreasing simple carbohydrates to help decrease the risk of diabetes. Recheck labs in 3 months.  5. Other hyperlipidemia Cardiovascular risk and specific lipid/LDL goals reviewed.  We discussed several lifestyle modifications today and Ashley [name] will continue to work on diet, exercise and weight loss efforts. Orders and follow up as documented in patient record. Follow up in 3 months.  Counseling Intensive lifestyle modifications are the first line treatment for this issue. Dietary changes: Increase soluble fiber. Decrease simple carbohydrates. Exercise changes: Moderate to vigorous-intensity aerobic activity 150 minutes per week if tolerated. Lipid-lowering medications: see documented in medical record.  6. Depression screening Ashley [name] had a positive depression screening. Depression is commonly associated with obesity and often results in emotional eating behaviors. We will monitor this closely and work on CBT to help improve the non-hunger eating patterns. Referral to Psychology may be required if no improvement is seen as she continues in our clinic.  7. At risk for deficient intake of food Kaylinn was given approximately 15 minutes of deficit  intake of food prevention counseling today. Terecia is at risk for eating too few calories based on current food recall. She was encouraged to focus on meeting caloric and protein goals according to her recommended meal plan.   8. Obesity with current BMI of 30.3  Ashley [name] is currently in the action stage of change and her goal is to continue with weight loss efforts. I recommend Ashley [name] begin the structured treatment plan as follows:  She has agreed to the Category 2 Plan.  Exercise goals:  As is    Behavioral modification strategies: increasing lean protein intake, no skipping meals, and meal planning and cooking strategies.  She was informed of the importance of frequent follow-up visits to maximize her success with intensive lifestyle modifications for her multiple health conditions. She was informed we would discuss her lab results at her next visit unless there is a critical issue that needs to be addressed sooner. Lynette agreed to keep her next visit at the agreed upon time to discuss these results.  Objective:   Blood pressure 95/62, pulse 77, temperature 98.3 F (36.8 C), height 5\' 4"  (1.626 m), weight 176 lb (79.8 kg), last menstrual period 03/06/2021, SpO2 99 %. Body mass index is 30.21 kg/m.  EKG: Normal sinus rhythm, rate 74.  Indirect Calorimeter completed today shows a VO2 of 324 and a REE of 1440.  Her calculated basal metabolic rate is 2500 thus her basal metabolic rate is worse than expected.  General: Cooperative, alert, well developed, in no acute distress. HEENT: Conjunctivae and lids unremarkable. Cardiovascular: Regular rhythm.  Lungs: Normal work of breathing. Neurologic: No focal deficits.   Lab Results  Component Value Date   CREATININE 0.64 11/08/2016   BUN 9 11/08/2016   NA 136 11/08/2016   K 4.2 11/08/2016   CL 104 11/08/2016   CO2 26 11/08/2016   Lab Results  Component Value Date   ALT 18 11/08/2016   AST 23 11/08/2016   ALKPHOS 63 11/08/2016    BILITOT 0.4 11/08/2016   Lab Results  Component Value Date   HGBA1C 5.8 (H) 03/17/2015   No results found for: INSULIN Lab Results  Component Value Date   TSH 1.918 03/17/2015   Lab Results  Component Value Date   CHOL 223 (H) 03/17/2015   HDL 59 03/17/2015   TRIG 120 03/17/2015   Lab Results  Component Value Date   WBC 13.2 (H) 11/08/2016   HGB 12.3 11/08/2016   HCT 37.3 11/08/2016   MCV 83.4 11/08/2016   PLT 281 11/08/2016    Attestation Statements:   Reviewed by clinician on day of visit: allergies, medications, problem list, medical history, surgical history, family history, social history, and previous encounter notes.  Edmund Hilda, CMA, am acting as transcriptionist for Reuben Likes, MD.  This is the patient's first visit at [facility]. The patient's NEW PATIENT PACKET was reviewed at length. Included in the packet: current and past health history, medications, allergies, ROS, gynecologic history (women only), surgical history, family history, social history, weight history, weight loss surgery history (for those that have had weight loss surgery), nutritional evaluation, mood and food questionnaire, PHQ9, Epworth questionnaire, sleep habits questionnaire, patient life and health improvement goals questionnaire. These will all be scanned into the patient's chart under media.   During the visit, I independently reviewed the patient's EKG, bioimpedance scale results, and indirect calorimeter results. I used this information to tailor a meal plan for the patient that will help her to lose weight and will improve her obesity-related conditions going forward. I performed a medically necessary appropriate examination and/or evaluation. I discussed the assessment and treatment plan with the patient. The patient was provided an opportunity to ask questions and all were answered. The patient agreed with the plan and demonstrated an understanding of the  instructions. Labs were ordered at this visit and will be reviewed at the next visit unless more critical results need to be addressed immediately. Clinical information was updated and documented in the EMR.   Time spent on visit including pre-visit chart review and post-visit care was 45 minutes.   A separate 15 minutes was spent on risk counseling (see above).  I have reviewed the above documentation for accuracy and completeness, and I agree with the above. - Reuben Likes, MD

## 2021-04-14 LAB — T4, FREE: Free T4: 1.03 ng/dL (ref 0.82–1.77)

## 2021-04-14 LAB — T3: T3, Total: 93 ng/dL (ref 71–180)

## 2021-04-14 LAB — FOLATE: Folate: 10.7 ng/mL (ref >3.0)

## 2021-04-27 ENCOUNTER — Other Ambulatory Visit: Payer: Self-pay

## 2021-04-27 ENCOUNTER — Encounter (INDEPENDENT_AMBULATORY_CARE_PROVIDER_SITE_OTHER): Payer: Self-pay | Admitting: Family Medicine

## 2021-04-27 ENCOUNTER — Ambulatory Visit (INDEPENDENT_AMBULATORY_CARE_PROVIDER_SITE_OTHER): Payer: 59 | Admitting: Family Medicine

## 2021-04-27 VITALS — BP 106/67 | HR 79 | Temp 98.1°F | Ht 64.0 in | Wt 175.0 lb

## 2021-04-27 DIAGNOSIS — R7303 Prediabetes: Secondary | ICD-10-CM

## 2021-04-27 DIAGNOSIS — Z9189 Other specified personal risk factors, not elsewhere classified: Secondary | ICD-10-CM

## 2021-04-27 DIAGNOSIS — E7849 Other hyperlipidemia: Secondary | ICD-10-CM | POA: Diagnosis not present

## 2021-04-27 DIAGNOSIS — E559 Vitamin D deficiency, unspecified: Secondary | ICD-10-CM | POA: Diagnosis not present

## 2021-04-27 DIAGNOSIS — Z6830 Body mass index (BMI) 30.0-30.9, adult: Secondary | ICD-10-CM

## 2021-04-27 DIAGNOSIS — E669 Obesity, unspecified: Secondary | ICD-10-CM | POA: Diagnosis not present

## 2021-04-27 MED ORDER — VITAMIN D (ERGOCALCIFEROL) 1.25 MG (50000 UNIT) PO CAPS: 50000.0000 [IU] | ORAL_CAPSULE | ORAL | 0 refills | Status: DC

## 2021-04-28 NOTE — Progress Notes (Signed)
Chief Complaint:   OBESITY Ashley [name] is here to discuss her progress with her obesity treatment plan along with follow-up of her obesity related diagnoses. Ashley [name] is on the Category 2 Plan and states she is following her eating plan approximately 90% of the time. Ashley [name] states she is walking and doing 100 squats a day 10 minutes 5-6 times per week.  Today's visit was #: 2 Starting weight: 176 lbs Starting date: 04/13/2021 Today's weight: 175 lbs Today's date: 04/27/2021 Total lbs lost to date: 1 Total lbs lost since last in-office visit: 1  Interim History: Ashley [name] is tired of breakfast, especially eggs. She ate all fruit and needs to restock. Pt is used to eating sausage, bacon, and grits for breakfast. She is interested in breakfast options that includes cereal. Food is a good quantity but pt is having to space it out. This week she has friends and family in town, then Sunday she is going to [employer]. She is planning to go to [facility] October 6 for a convention. She denies hunger. She is eating cheese for snacks, snap peas, or small cookie or candy.  Subjective:   1. Vitamin D deficiency Pt denies nausea, vomiting, and muscle weakness but notes fatigue. Her last Vit D level was 26.1.  2. Other hyperlipidemia Ashley [name]'s last LDL was 154, HDL 75, triglycerides 64, and total 240. She is not on statin therapy.  3. Pre-diabetes Pt's last A1c was 5.5 and insulin level 7.2. She is not on medication.  4. At risk for osteoporosis Ashley [name] is at higher risk of osteopenia and osteoporosis due to Vitamin D deficiency.   Assessment/Plan:   1. Vitamin D deficiency Low Vitamin D level contributes to fatigue and are associated with obesity, breast, and colon cancer. She will start prescription Vitamin D 50,000 IU every week and will follow-up for routine testing of Vitamin D, at least 2-3 times per year to avoid over-replacement. Pt is to keep OTC Vit D for when she reaches goal.  Start-  Vitamin D, Ergocalciferol, (DRISDOL) 1.25 MG (50000 UNIT) CAPS capsule; Take 1 capsule (50,000 Units total) by mouth every 7 (seven) days.  Dispense: 4 capsule; Refill: 0  2. Other hyperlipidemia Cardiovascular risk and specific lipid/LDL goals reviewed.  We discussed several lifestyle modifications today and Ashley [name] will continue to work on diet, exercise and weight loss efforts. Orders and follow up as documented in patient record. Repeat labs in 3 months.  Counseling Intensive lifestyle modifications are the first line treatment for this issue. Dietary changes: Increase soluble fiber. Decrease simple carbohydrates. Exercise changes: Moderate to vigorous-intensity aerobic activity 150 minutes per week if tolerated. Lipid-lowering medications: see documented in medical record.  3. Pre-diabetes Ashley [name] will continue to work on weight loss, exercise, and decreasing simple carbohydrates to help decrease the risk of diabetes. Repeat labs in 3 months.  4. At risk for osteoporosis Ashley [name] was given approximately 30 minutes of osteoporosis prevention counseling today. Ashley [name] is at risk for osteopenia and osteoporosis due to her Vitamin D deficiency. She was encouraged to take her Vitamin D and follow her higher calcium diet and increase strengthening exercise to help strengthen her bones and decrease her risk of osteopenia and osteoporosis.  Repetitive spaced learning was employed today to elicit superior memory formation and behavioral change.  5. Obesity with current BMI of 30.1  Ashley [name] is currently in the action stage of change. As such, her goal is to continue with weight loss efforts. She has agreed to the  Category 2 Plan with breakfast options.   Exercise goals:  As is  Behavioral modification strategies: increasing lean protein intake, meal planning and cooking strategies, keeping healthy foods in the home, and planning for success.  Ashley [name] has agreed to follow-up with our clinic in 2  weeks. She was informed of the importance of frequent follow-up visits to maximize her success with intensive lifestyle modifications for her multiple health conditions.   Objective:   Blood pressure 106/67, pulse 79, temperature 98.1 F (36.7 C), height 5\' 4"  (1.626 m), weight 175 lb (79.4 kg), last menstrual period 04/04/2021, SpO2 99 %. Body mass index is 30.04 kg/m.  General: Cooperative, alert, well developed, in no acute distress. HEENT: Conjunctivae and lids unremarkable. Cardiovascular: Regular rhythm.  Lungs: Normal work of breathing. Neurologic: No focal deficits.   Lab Results  Component Value Date   CREATININE 0.64 11/08/2016   BUN 9 11/08/2016   NA 136 11/08/2016   K 4.2 11/08/2016   CL 104 11/08/2016   CO2 26 11/08/2016   Lab Results  Component Value Date   ALT 18 11/08/2016   AST 23 11/08/2016   ALKPHOS 63 11/08/2016   BILITOT 0.4 11/08/2016   Lab Results  Component Value Date   HGBA1C 5.8 (H) 03/17/2015   No results found for: INSULIN Lab Results  Component Value Date   TSH 1.918 03/17/2015   Lab Results  Component Value Date   CHOL 223 (H) 03/17/2015   HDL 59 03/17/2015   TRIG 120 03/17/2015   No results found for: VD25OH Lab Results  Component Value Date   WBC 13.2 (H) 11/08/2016   HGB 12.3 11/08/2016   HCT 37.3 11/08/2016   MCV 83.4 11/08/2016   PLT 281 11/08/2016    Attestation Statements:   Reviewed by clinician on day of visit: allergies, medications, problem list, medical history, surgical history, family history, social history, and previous encounter notes.  01/08/2017, CMA, am acting as transcriptionist for Edmund Hilda, MD.   I have reviewed the above documentation for accuracy and completeness, and I agree with the above. - Reuben Likes, MD

## 2021-05-11 ENCOUNTER — Ambulatory Visit (INDEPENDENT_AMBULATORY_CARE_PROVIDER_SITE_OTHER): Payer: 59 | Admitting: Family Medicine

## 2021-05-11 ENCOUNTER — Other Ambulatory Visit: Payer: Self-pay

## 2021-05-11 ENCOUNTER — Encounter (INDEPENDENT_AMBULATORY_CARE_PROVIDER_SITE_OTHER): Payer: Self-pay | Admitting: Family Medicine

## 2021-05-11 VITALS — BP 108/68 | HR 85 | Temp 98.2°F | Ht 64.0 in | Wt 175.0 lb

## 2021-05-11 DIAGNOSIS — E669 Obesity, unspecified: Secondary | ICD-10-CM | POA: Diagnosis not present

## 2021-05-11 DIAGNOSIS — Z6830 Body mass index (BMI) 30.0-30.9, adult: Secondary | ICD-10-CM | POA: Diagnosis not present

## 2021-05-11 DIAGNOSIS — R7303 Prediabetes: Secondary | ICD-10-CM | POA: Diagnosis not present

## 2021-05-11 DIAGNOSIS — E559 Vitamin D deficiency, unspecified: Secondary | ICD-10-CM

## 2021-05-11 MED ORDER — VITAMIN D (ERGOCALCIFEROL) 1.25 MG (50000 UNIT) PO CAPS: 50000.0000 [IU] | ORAL_CAPSULE | ORAL | 0 refills | Status: DC

## 2021-05-11 NOTE — Progress Notes (Signed)
Chief Complaint:   OBESITY Ashley [name] is here to discuss her progress with her obesity treatment plan along with follow-up of her obesity related diagnoses. Ashley [name] is on the Category 2 Plan and states she is following her eating plan approximately 50-65% of the time. Ashley [name] states she is walking the dog 10 minutes 5-6 times per week and doing 140 squats 5 times per week.  Today's visit was #: 3 Starting weight: 176 lbs Starting date: 04/13/2021 Today's weight: 175 lbs Today's date: 05/11/2021 Total lbs lost to date: 1 Total lbs lost since last in-office visit: 0  Interim History: Ashley [name] is trying to stick to just 200 calorie snacks. She is using caramel for apples but is not measuring caramel. She is cravings sweets fairly significantly during her cycle. Pt has been trying Hershey's Chocolate Milk to get more protein in. She is trying to make some substitutions but may not always get everything in.  Subjective:   1. Vitamin D deficiency Pt denies nausea, vomiting, and muscle weakness but notes fatigue. She is on prescription Vit D.  2. Pre-diabetes Ashley [name] is not on medication and reports carb cravings, especially during her cycle.  Assessment/Plan:   1. Vitamin D deficiency Low Vitamin D level contributes to fatigue and are associated with obesity, breast, and colon cancer. She agrees to continue to take prescription Vitamin D 50,000 IU every week and will follow-up for routine testing of Vitamin D, at least 2-3 times per year to avoid over-replacement.  Refill- Vitamin D, Ergocalciferol, (DRISDOL) 1.25 MG (50000 UNIT) CAPS capsule; Take 1 capsule (50,000 Units total) by mouth every 7 (seven) days.  Dispense: 4 capsule; Refill: 0  2. Pre-diabetes Ashley [name] will continue to work on weight loss, exercise, and decreasing simple carbohydrates to help decrease the risk of diabetes. Follow up labs in November 2022.  3. Obesity with current BMI of 30.1  Ashley [name] is currently in the  action stage of change. As such, her goal is to continue with weight loss efforts. She has agreed to keeping a food journal and adhering to recommended goals of 1100-1200 calories and 80+ grams protein.   Exercise goals:  As is  Behavioral modification strategies: increasing lean protein intake, meal planning and cooking strategies, keeping healthy foods in the home, and keeping a strict food journal.  Ashley [name] has agreed to follow-up with our clinic in 3 weeks. She was informed of the importance of frequent follow-up visits to maximize her success with intensive lifestyle modifications for her multiple health conditions.   Objective:   Blood pressure 108/68, pulse 85, temperature 98.2 F (36.8 C), height 5\' 4"  (1.626 m), weight 175 lb (79.4 kg), last menstrual period 05/03/2021. Body mass index is 30.04 kg/m.  General: Cooperative, alert, well developed, in no acute distress. HEENT: Conjunctivae and lids unremarkable. Cardiovascular: Regular rhythm.  Lungs: Normal work of breathing. Neurologic: No focal deficits.   Lab Results  Component Value Date   CREATININE 0.64 11/08/2016   BUN 9 11/08/2016   NA 136 11/08/2016   K 4.2 11/08/2016   CL 104 11/08/2016   CO2 26 11/08/2016   Lab Results  Component Value Date   ALT 18 11/08/2016   AST 23 11/08/2016   ALKPHOS 63 11/08/2016   BILITOT 0.4 11/08/2016   Lab Results  Component Value Date   HGBA1C 5.8 (H) 03/17/2015   No results found for: INSULIN Lab Results  Component Value Date   TSH 1.918 03/17/2015   Lab Results  Component  Value Date   CHOL 223 (H) 03/17/2015   HDL 59 03/17/2015   TRIG 120 03/17/2015   No results found for: VD25OH Lab Results  Component Value Date   WBC 13.2 (H) 11/08/2016   HGB 12.3 11/08/2016   HCT 37.3 11/08/2016   MCV 83.4 11/08/2016   PLT 281 11/08/2016    Attestation Statements:   Reviewed by clinician on day of visit: allergies, medications, problem list, medical history, surgical  history, family history, social history, and previous encounter notes.  Edmund Hilda, CMA, am acting as transcriptionist for Reuben Likes, MD.   I have reviewed the above documentation for accuracy and completeness, and I agree with the above. - Reuben Likes, MD

## 2021-05-25 ENCOUNTER — Ambulatory Visit (INDEPENDENT_AMBULATORY_CARE_PROVIDER_SITE_OTHER): Payer: 59 | Admitting: Family Medicine

## 2021-06-02 ENCOUNTER — Encounter (INDEPENDENT_AMBULATORY_CARE_PROVIDER_SITE_OTHER): Payer: Self-pay | Admitting: Family Medicine

## 2021-06-02 ENCOUNTER — Other Ambulatory Visit: Payer: Self-pay

## 2021-06-02 ENCOUNTER — Ambulatory Visit (INDEPENDENT_AMBULATORY_CARE_PROVIDER_SITE_OTHER): Payer: 59 | Admitting: Family Medicine

## 2021-06-02 ENCOUNTER — Encounter (HOSPITAL_COMMUNITY): Payer: Self-pay | Admitting: *Deleted

## 2021-06-02 VITALS — BP 97/60 | HR 77 | Temp 98.2°F | Ht 64.0 in | Wt 177.0 lb

## 2021-06-02 DIAGNOSIS — E559 Vitamin D deficiency, unspecified: Secondary | ICD-10-CM

## 2021-06-02 DIAGNOSIS — E669 Obesity, unspecified: Secondary | ICD-10-CM

## 2021-06-02 DIAGNOSIS — Z6830 Body mass index (BMI) 30.0-30.9, adult: Secondary | ICD-10-CM

## 2021-06-02 NOTE — Progress Notes (Signed)
Chief Complaint:   OBESITY Ashley [name] is here to discuss her progress with her obesity treatment plan along with follow-up of her obesity related diagnoses. Ashley [name] is on keeping a food journal and adhering to recommended goals of 1100-1200 calories and 80+ grams protein and states she is following her eating plan approximately 50% of the time. Adam states she is walking and YouTube videos 10-30 minutes 3-5 times per week.  Today's visit was #: 4 Starting weight: 176 lbs Starting date: 04/13/2021 Today's weight: 177 lbs Today's date: 06/02/2021 Total lbs lost to date: 0 Total lbs lost since last in-office visit: 0  Interim History: Ashley [name] finds she is getting hungry and desire for sweets has increased. She has not journaled and was following category 2. Food on plan is not necessarily food she eats. She is recognizing she doesn't really care for lean food. Pt voices she may want to lean more on [employer]. She has homecoming this weekend and going to [place] next weekend.  Subjective:   1. Vitamin D deficiency Pt denies nausea, vomiting, and muscle weakness but notes fatigue. She is on prescription Vit D.  Assessment/Plan:   1. Vitamin D deficiency Low Vitamin D level contributes to fatigue and are associated with obesity, breast, and colon cancer. She agrees to continue to take prescription Vitamin D @50 ,000 IU every week and will follow-up for routine testing of Vitamin D, at least 2-3 times per year to avoid over-replacement. No refill needed.  2. Obesity with current BMI of 30.4  Ashley [name] is currently in the action stage of change. As such, her goal is to continue with weight loss efforts. She has agreed to keeping a food journal and adhering to recommended goals of 1100-1200 calories and 80+ grams protein.   Exercise goals: All adults should avoid inactivity. Some physical activity is better than none, and adults who participate in any amount of physical activity gain some  health benefits.  Behavioral modification strategies: increasing lean protein intake, meal planning and cooking strategies, keeping healthy foods in the home, and planning for success.  Ashley [name] has agreed to follow-up with our clinic in 2 weeks. She was informed of the importance of frequent follow-up visits to maximize her success with intensive lifestyle modifications for her multiple health conditions.   Objective:   Blood pressure 97/60, pulse 77, temperature 98.2 F (36.8 C), height 5\' 4"  (1.626 m), weight 177 lb (80.3 kg), last menstrual period 06/01/2021, SpO2 99 %. Body mass index is 30.38 kg/m.  General: Cooperative, alert, well developed, in no acute distress. HEENT: Conjunctivae and lids unremarkable. Cardiovascular: Regular rhythm.  Lungs: Normal work of breathing. Neurologic: No focal deficits.   Lab Results  Component Value Date   CREATININE 0.64 11/08/2016   BUN 9 11/08/2016   NA 136 11/08/2016   K 4.2 11/08/2016   CL 104 11/08/2016   CO2 26 11/08/2016   Lab Results  Component Value Date   ALT 18 11/08/2016   AST 23 11/08/2016   ALKPHOS 63 11/08/2016   BILITOT 0.4 11/08/2016   Lab Results  Component Value Date   HGBA1C 5.8 (H) 03/17/2015   No results found for: INSULIN Lab Results  Component Value Date   TSH 1.918 03/17/2015   Lab Results  Component Value Date   CHOL 223 (H) 03/17/2015   HDL 59 03/17/2015   TRIG 120 03/17/2015   No results found for: VD25OH Lab Results  Component Value Date   WBC 13.2 (H) 11/08/2016  HGB 12.3 11/08/2016   HCT 37.3 11/08/2016   MCV 83.4 11/08/2016   PLT 281 11/08/2016    Attestation Statements:   Reviewed by clinician on day of visit: allergies, medications, problem list, medical history, surgical history, family history, social history, and previous encounter notes.  Time spent on visit including pre-visit chart review and post-visit care and charting was 15 minutes.   Edmund Hilda, CMA, am  acting as transcriptionist for Reuben Likes, MD.   I have reviewed the above documentation for accuracy and completeness, and I agree with the above. - Reuben Likes, MD

## 2021-06-16 ENCOUNTER — Ambulatory Visit (INDEPENDENT_AMBULATORY_CARE_PROVIDER_SITE_OTHER): Payer: 59 | Admitting: Family Medicine

## 2021-06-16 ENCOUNTER — Other Ambulatory Visit: Payer: Self-pay

## 2021-06-16 ENCOUNTER — Encounter (INDEPENDENT_AMBULATORY_CARE_PROVIDER_SITE_OTHER): Payer: Self-pay | Admitting: Family Medicine

## 2021-06-16 VITALS — BP 100/62 | HR 75 | Temp 98.4°F | Ht 64.0 in | Wt 177.0 lb

## 2021-06-16 DIAGNOSIS — E559 Vitamin D deficiency, unspecified: Secondary | ICD-10-CM

## 2021-06-16 DIAGNOSIS — E669 Obesity, unspecified: Secondary | ICD-10-CM | POA: Diagnosis not present

## 2021-06-16 DIAGNOSIS — Z6830 Body mass index (BMI) 30.0-30.9, adult: Secondary | ICD-10-CM

## 2021-06-16 DIAGNOSIS — E7849 Other hyperlipidemia: Secondary | ICD-10-CM | POA: Diagnosis not present

## 2021-06-16 MED ORDER — VITAMIN D (ERGOCALCIFEROL) 1.25 MG (50000 UNIT) PO CAPS: 50000.0000 [IU] | ORAL_CAPSULE | ORAL | 0 refills | Status: DC

## 2021-06-20 NOTE — Progress Notes (Signed)
Chief Complaint:   OBESITY Ashley [name] is here to discuss her progress with her obesity treatment plan along with follow-up of her obesity related diagnoses. Ashley [name] is on keeping a food journal and adhering to recommended goals of 1100-1200 calories and 80+ grams protein and states she is following her eating plan approximately 50% of the time. 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  Today's visit was #: 5 Starting weight: 176 lbs Starting date: 04/13/2021 Today's weight: 177 lbs Today's date: 06/16/2021 Total lbs lost to date: 0 Total lbs lost since last in-office visit: 0  Interim History: Ashley [name] is eating more and snacking while working. She drinks water to help. Snacks include cheetos, doritos, peanuts. Pt is logging breakfast and lunch but not dinner. She is getting an average of 40 grams protein per day. She has 400 calories left over in the evening. Her mom is cooking for the holidays, and she is going to [place] for Christmas.  Subjective:   1. Vitamin D deficiency Pt denies nausea, vomiting, and muscle weakness but notes fatigue. Her last Vit D level was 26.1.  2. Other hyperlipidemia Ashley [name] is still gravitating toward increased calorie foods with increased saturated fat. She is not on medication.  Assessment/Plan:   1. Vitamin D deficiency Low Vitamin D level contributes to fatigue and are associated with obesity, breast, and colon cancer. She agrees to continue to take prescription Vitamin D 50,000 IU every week and will follow-up for routine testing of Vitamin D, at least 2-3 times per year to avoid over-replacement.  Refill- Vitamin D, Ergocalciferol, (DRISDOL) 1.25 MG (50000 UNIT) CAPS capsule; Take 1 capsule (50,000 Units total) by mouth every 7 (seven) days.  Dispense: 4 capsule; Refill: 0  2. Other hyperlipidemia Cardiovascular risk and specific lipid/LDL goals reviewed.  We discussed several lifestyle  modifications today and Ashley [name] will continue to work on diet, exercise and weight loss efforts. Orders and follow up as documented in patient record. Pt counseled on mindfulness of calorie and protein intake.  Counseling Intensive lifestyle modifications are the first line treatment for this issue. Dietary changes: Increase soluble fiber. Decrease simple carbohydrates. Exercise changes: Moderate to vigorous-intensity aerobic activity 150 minutes per week if tolerated. Lipid-lowering medications: see documented in medical record.  3. Obesity with current BMI of 30.5  Ashley [name] is currently in the action stage of change. As such, her goal is to continue with weight loss efforts. She has agreed to keeping a food journal and adhering to recommended goals of 1100-1200 calories and 80+ grams protein.   Exercise goals:  As is  Behavioral modification strategies: increasing lean protein intake, meal planning and cooking strategies, keeping healthy foods in the home, better snacking choices, and planning for success.  Ashley [name] has agreed to follow-up with our clinic in 3-4 weeks. She was informed of the importance of frequent follow-up visits to maximize her success with intensive lifestyle modifications for her multiple health conditions.   Objective:   Blood pressure 100/62, pulse 75, temperature 98.4 F (36.9 C), height 5\' 4"  (1.626 m), weight 177 lb (80.3 kg), last menstrual period 06/01/2021. Body mass index is 30.38 kg/m.  General: Cooperative, alert, well developed, in no acute distress. HEENT: Conjunctivae and lids unremarkable. Cardiovascular: Regular rhythm.  Lungs: Normal work of breathing. Neurologic: No focal deficits.   Lab Results  Component Value Date   CREATININE 0.64 11/08/2016   BUN 9 11/08/2016   NA 136  11/08/2016   K 4.2 11/08/2016   CL 104 11/08/2016   CO2 26 11/08/2016   Lab Results  Component Value Date   ALT 18 11/08/2016   AST 23 11/08/2016   ALKPHOS 63  11/08/2016   BILITOT 0.4 11/08/2016   Lab Results  Component Value Date   HGBA1C 5.8 (H) 03/17/2015   No results found for: INSULIN Lab Results  Component Value Date   TSH 1.918 03/17/2015   Lab Results  Component Value Date   CHOL 223 (H) 03/17/2015   HDL 59 03/17/2015   TRIG 120 03/17/2015   No results found for: VD25OH Lab Results  Component Value Date   WBC 13.2 (H) 11/08/2016   HGB 12.3 11/08/2016   HCT 37.3 11/08/2016   MCV 83.4 11/08/2016   PLT 281 11/08/2016    Attestation Statements:   Reviewed by clinician on day of visit: allergies, medications, problem list, medical history, surgical history, family history, social history, and previous encounter notes.  Edmund Hilda, CMA, am acting as transcriptionist for Reuben Likes, MD.   I have reviewed the above documentation for accuracy and completeness, and I agree with the above. - Reuben Likes, MD

## 2021-07-14 ENCOUNTER — Ambulatory Visit (INDEPENDENT_AMBULATORY_CARE_PROVIDER_SITE_OTHER): Payer: 59 | Admitting: Family Medicine

## 2021-07-14 ENCOUNTER — Other Ambulatory Visit: Payer: Self-pay

## 2021-07-14 ENCOUNTER — Encounter (INDEPENDENT_AMBULATORY_CARE_PROVIDER_SITE_OTHER): Payer: Self-pay | Admitting: Family Medicine

## 2021-07-14 VITALS — BP 102/66 | HR 78 | Temp 98.2°F | Ht 64.0 in | Wt 175.0 lb

## 2021-07-14 DIAGNOSIS — E669 Obesity, unspecified: Secondary | ICD-10-CM | POA: Diagnosis not present

## 2021-07-14 DIAGNOSIS — E559 Vitamin D deficiency, unspecified: Secondary | ICD-10-CM

## 2021-07-14 DIAGNOSIS — R7303 Prediabetes: Secondary | ICD-10-CM | POA: Diagnosis not present

## 2021-07-14 DIAGNOSIS — E7849 Other hyperlipidemia: Secondary | ICD-10-CM

## 2021-07-14 DIAGNOSIS — Z6830 Body mass index (BMI) 30.0-30.9, adult: Secondary | ICD-10-CM

## 2021-07-14 MED ORDER — VITAMIN D (ERGOCALCIFEROL) 1.25 MG (50000 UNIT) PO CAPS: 50000.0000 [IU] | ORAL_CAPSULE | ORAL | 0 refills | Status: DC

## 2021-07-14 NOTE — Progress Notes (Signed)
Chief Complaint:   OBESITY See is here to discuss her progress with her obesity treatment plan along with follow-up of her obesity related diagnoses. Ashley [name] is on keeping a food journal and adhering to recommended goals of 1100-1200 calories and 80 grams protein and states she is following her eating plan approximately 70% of the time. Ashley [name] states she is walking, biking, and arm exercises 10-20 minutes 2-5 times per week.  Today's visit was #: 6 Starting weight: 176 lbs Starting date: 04/13/2021 Today's weight: 175 lbs Today's date: 07/14/2021 Total lbs lost to date: 1 Total lbs lost since last in-office visit: 2  Interim History: Ashley [name] had a good Thanksgiving and was very mindful of choices. Over the next few weeks, pt has a trip to [place] for Christmas and New Years at home. Her insurance does not cover any weight loss meds. She recognizes that she eats some high calorie foods. Pt is trying to incorporate more exercise into her daily routine. She wants to cook more at home.  Subjective:   1. Vitamin D deficiency Pt denies nausea, vomiting, and muscle weakness but notes fatigue. Her last Vit D level was 26..  2. Prediabetes Ashley [name]'s last A1c was 5.5 with an insulin level of 9. She is not on medication.  3. Other hyperlipidemia Pt's last LDL was >100 and she is not on statin therapy.  Assessment/Plan:   1. Vitamin D deficiency Low Vitamin D level contributes to fatigue and are associated with obesity, breast, and colon cancer. She agrees to continue to take prescription Vitamin D 50,000 IU every week and will follow-up for routine testing of Vitamin D, at least 2-3 times per year to avoid over-replacement. Check labs today.  - VITAMIN D 25 Hydroxy (Vit-D Deficiency, Fractures)  Refill- Vitamin D, Ergocalciferol, (DRISDOL) 1.25 MG (50000 UNIT) CAPS capsule; Take 1 capsule (50,000 Units total) by mouth every 7 (seven) days.  Dispense: 4 capsule; Refill: 0  2.  Prediabetes Ashley [name] will continue to work on weight loss, exercise, and decreasing simple carbohydrates to help decrease the risk of diabetes. Check labs today. - Hemoglobin A1c - Insulin, random  3. Other hyperlipidemia Cardiovascular risk and specific lipid/LDL goals reviewed.  We discussed several lifestyle modifications today and Ashley [name] will continue to work on diet, exercise and weight loss efforts. Orders and follow up as documented in patient record.   Counseling Intensive lifestyle modifications are the first line treatment for this issue. Dietary changes: Increase soluble fiber. Decrease simple carbohydrates. Exercise changes: Moderate to vigorous-intensity aerobic activity 150 minutes per week if tolerated. Lipid-lowering medications: see documented in medical record. Check labs today.  - Lipid Panel With LDL/HDL Ratio  4. Obesity with current BMI of 30.0  Ashley [name] is currently in the action stage of change. As such, her goal is to continue with weight loss efforts. She has agreed to keeping a food journal and adhering to recommended goals of 1100-1200 calories and 80+ grams protein.   Exercise goals: All adults should avoid inactivity. Some physical activity is better than none, and adults who participate in any amount of physical activity gain some health benefits.  Behavioral modification strategies: increasing lean protein intake, meal planning and cooking strategies, and keeping healthy foods in the home.  Ashley [name] has agreed to follow-up with our clinic in 3-4 weeks. She was informed of the importance of frequent follow-up visits to maximize her success with intensive lifestyle modifications for her multiple health conditions.   Ashley [name] was informed we would  discuss her lab results at her next visit unless there is a critical issue that needs to be addressed sooner. Ashley [name] agreed to keep her next visit at the agreed upon time to discuss these results.  Objective:    Blood pressure 102/66, pulse 78, temperature 98.2 F (36.8 C), height 5\' 4"  (1.626 m), weight 175 lb (79.4 kg), last menstrual period 06/29/2021, SpO2 99 %. Body mass index is 30.04 kg/m.  General: Cooperative, alert, well developed, in no acute distress. HEENT: Conjunctivae and lids unremarkable. Cardiovascular: Regular rhythm.  Lungs: Normal work of breathing. Neurologic: No focal deficits.   Lab Results  Component Value Date   CREATININE 0.64 11/08/2016   BUN 9 11/08/2016   NA 136 11/08/2016   K 4.2 11/08/2016   CL 104 11/08/2016   CO2 26 11/08/2016   Lab Results  Component Value Date   ALT 18 11/08/2016   AST 23 11/08/2016   ALKPHOS 63 11/08/2016   BILITOT 0.4 11/08/2016   Lab Results  Component Value Date   HGBA1C 5.8 (H) 03/17/2015   No results found for: INSULIN Lab Results  Component Value Date   TSH 1.918 03/17/2015   Lab Results  Component Value Date   CHOL 223 (H) 03/17/2015   HDL 59 03/17/2015   TRIG 120 03/17/2015   No results found for: VD25OH Lab Results  Component Value Date   WBC 13.2 (H) 11/08/2016   HGB 12.3 11/08/2016   HCT 37.3 11/08/2016   MCV 83.4 11/08/2016   PLT 281 11/08/2016    Attestation Statements:   Reviewed by clinician on day of visit: allergies, medications, problem list, medical history, surgical history, family history, social history, and previous encounter notes.  01/08/2017, CMA, am acting as transcriptionist for Edmund Hilda, MD.   I have reviewed the above documentation for accuracy and completeness, and I agree with the above. - Reuben Likes, MD

## 2021-07-15 LAB — HEMOGLOBIN A1C
Est. average glucose Bld gHb Est-mCnc: 105 mg/dL
Hgb A1c MFr Bld: 5.3 % (ref 4.8–5.6)

## 2021-07-15 LAB — VITAMIN D 25 HYDROXY (VIT D DEFICIENCY, FRACTURES): Vit D, 25-Hydroxy: 35.9 ng/mL (ref 30.0–100.0)

## 2021-07-15 LAB — LIPID PANEL WITH LDL/HDL RATIO
Cholesterol, Total: 226 mg/dL — ABNORMAL HIGH (ref 100–199)
HDL: 78 mg/dL (ref >39)
LDL Chol Calc (NIH): 136 mg/dL — ABNORMAL HIGH (ref 0–99)
LDL/HDL Ratio: 1.7 ratio (ref 0.0–3.2)
Triglycerides: 67 mg/dL (ref 0–149)
VLDL Cholesterol Cal: 12 mg/dL (ref 5–40)

## 2021-07-15 LAB — INSULIN, RANDOM: INSULIN: 4.8 u[IU]/mL (ref 2.6–24.9)

## 2021-08-11 ENCOUNTER — Ambulatory Visit (INDEPENDENT_AMBULATORY_CARE_PROVIDER_SITE_OTHER): Payer: 59 | Admitting: Family Medicine

## 2021-08-11 ENCOUNTER — Encounter (INDEPENDENT_AMBULATORY_CARE_PROVIDER_SITE_OTHER): Payer: Self-pay | Admitting: Family Medicine

## 2021-08-11 ENCOUNTER — Other Ambulatory Visit: Payer: Self-pay

## 2021-08-11 VITALS — BP 110/73 | HR 68 | Temp 98.2°F | Ht 64.0 in | Wt 174.0 lb

## 2021-08-11 DIAGNOSIS — E669 Obesity, unspecified: Secondary | ICD-10-CM

## 2021-08-11 DIAGNOSIS — Z6839 Body mass index (BMI) 39.0-39.9, adult: Secondary | ICD-10-CM | POA: Diagnosis not present

## 2021-08-11 DIAGNOSIS — E7849 Other hyperlipidemia: Secondary | ICD-10-CM | POA: Diagnosis not present

## 2021-08-15 NOTE — Progress Notes (Signed)
Chief Complaint:   OBESITY Ashley [name] is here to discuss her progress with her obesity treatment plan along with follow-up of her obesity related diagnoses. Ashley [name] is on keeping a food journal and adhering to recommended goals of 1100-1200 calories and 80+ grams protein and states she is following her eating plan approximately 85% of the time. Ashley [name] states she is walking, strength training, and elliptical 10-20 minutes 5 times per week.  Today's visit was #: 7 Starting weight: 176 lbs Starting date: 04/13/2021 Today's weight: 174 lbs Today's date: 08/11/2021 Total lbs lost to date: 2 Total lbs lost since last in-office visit: 1  Interim History: Ashley [name] has been more attentive to calories and logging food on [employer]. She is getting in 1400-1500 calories  daily and mid 40s-70 grams protein daily. Pt is working on getting vegetables more consistently. She has been trying to cut down on  rice and potatoes. Pt is going to [facility] next week. She is planning to probably eat out more in the next few weeks.  Subjective:   1. Other hyperlipidemia Pt has an LDL of 136, HDL 78, and triglycerides 67. She is not on statin therapy.  Assessment/Plan:   1. Other hyperlipidemia Cardiovascular risk and specific lipid/LDL goals reviewed.  We discussed several lifestyle modifications today and Ashley [name] will continue to work on diet, exercise and weight loss efforts. Orders and follow up as documented in patient record. Continue journaling with focus on increase protein to be in control of carbs and fat intake.  Counseling Intensive lifestyle modifications are the first line treatment for this issue. Dietary changes: Increase soluble fiber. Decrease simple carbohydrates. Exercise changes: Moderate to vigorous-intensity aerobic activity 150 minutes per week if tolerated. Lipid-lowering medications: see documented in medical record.  2. Obesity with current BMI of 29.9  Ashley [name] is currently in the  action stage of change. As such, her goal is to continue with weight loss efforts. She has agreed to keeping a food journal and adhering to recommended goals of 1200-1300 calories and 85+ grams protein.   Exercise goals: All adults should avoid inactivity. Some physical activity is better than none, and adults who participate in any amount of physical activity gain some health benefits.  Behavioral modification strategies: increasing lean protein intake, meal planning and cooking strategies, keeping healthy foods in the home, and planning for success.  Ashley [name] has agreed to follow-up with our clinic in 3-4 weeks. She was informed of the importance of frequent follow-up visits to maximize her success with intensive lifestyle modifications for her multiple health conditions.   Objective:   Blood pressure 110/73, pulse 68, temperature 98.2 F (36.8 C), height 5\' 4"  (1.626 m), weight 174 lb (78.9 kg), last menstrual period 07/30/2021, SpO2 99 %. Body mass index is 29.87 kg/m.  General: Cooperative, alert, well developed, in no acute distress. HEENT: Conjunctivae and lids unremarkable. Cardiovascular: Regular rhythm.  Lungs: Normal work of breathing. Neurologic: No focal deficits.   Lab Results  Component Value Date   CREATININE 0.64 11/08/2016   BUN 9 11/08/2016   NA 136 11/08/2016   K 4.2 11/08/2016   CL 104 11/08/2016   CO2 26 11/08/2016   Lab Results  Component Value Date   ALT 18 11/08/2016   AST 23 11/08/2016   ALKPHOS 63 11/08/2016   BILITOT 0.4 11/08/2016   Lab Results  Component Value Date   HGBA1C 5.3 07/14/2021   HGBA1C 5.8 (H) 03/17/2015   Lab Results  Component Value Date  INSULIN 4.8 07/14/2021   Lab Results  Component Value Date   TSH 1.918 03/17/2015   Lab Results  Component Value Date   CHOL 226 (H) 07/14/2021   HDL 78 07/14/2021   LDLCALC 136 (H) 07/14/2021   TRIG 67 07/14/2021   Lab Results  Component Value Date   VD25OH 35.9 07/14/2021    Lab Results  Component Value Date   WBC 13.2 (H) 11/08/2016   HGB 12.3 11/08/2016   HCT 37.3 11/08/2016   MCV 83.4 11/08/2016   PLT 281 11/08/2016    Attestation Statements:   Reviewed by clinician on day of visit: allergies, medications, problem list, medical history, surgical history, family history, social history, and previous encounter notes.  Coral Ceo, CMA, am acting as transcriptionist for Coralie Common, MD.  I have reviewed the above documentation for accuracy and completeness, and I agree with the above. - Coralie Common, MD

## 2021-09-12 ENCOUNTER — Ambulatory Visit (INDEPENDENT_AMBULATORY_CARE_PROVIDER_SITE_OTHER): Payer: 59 | Admitting: Family Medicine

## 2021-09-12 ENCOUNTER — Encounter (INDEPENDENT_AMBULATORY_CARE_PROVIDER_SITE_OTHER): Payer: Self-pay | Admitting: Family Medicine

## 2021-09-12 ENCOUNTER — Other Ambulatory Visit: Payer: Self-pay

## 2021-09-12 VITALS — BP 108/73 | HR 72 | Temp 98.1°F | Ht 64.0 in | Wt 173.0 lb

## 2021-09-12 DIAGNOSIS — E669 Obesity, unspecified: Secondary | ICD-10-CM | POA: Diagnosis not present

## 2021-09-12 DIAGNOSIS — E559 Vitamin D deficiency, unspecified: Secondary | ICD-10-CM

## 2021-09-12 DIAGNOSIS — Z6829 Body mass index (BMI) 29.0-29.9, adult: Secondary | ICD-10-CM | POA: Diagnosis not present

## 2021-09-12 DIAGNOSIS — E7849 Other hyperlipidemia: Secondary | ICD-10-CM | POA: Diagnosis not present

## 2021-09-12 MED ORDER — VITAMIN D (ERGOCALCIFEROL) 1.25 MG (50000 UNIT) PO CAPS: 50000.0000 [IU] | ORAL_CAPSULE | ORAL | 0 refills | Status: DC

## 2021-09-12 NOTE — Progress Notes (Signed)
Chief Complaint:   OBESITY Ashley [name] is here to discuss her progress with her obesity treatment plan along with follow-up of her obesity related diagnoses. Lux is on keeping a food journal and adhering to recommended goals of 1200-1300 calories and 85 gr protein and states she is following her eating plan approximately 75% of the time. Ashley [name] states she is walking or biking 10-20 minutes 5 times per week.  Today's visit was #: 8 Starting weight: 176 lbs Starting date: 04/13/2021 Today's weight: 173 lbs Today's date: 09/12/2021 Total lbs lost to date: 3 Total lbs lost since last in-office visit: 1  Interim History: Pt has been mostly working over the last few weeks. She is noticing more carb cravings. Pt got some yogurt to help with desire for sweets. She is staying around 1400 calories daily and almost meeting protein goal (ending up ~75 gr). Pt is recognizing that she gravitates to carbohydrates.  Subjective:   1. Vitamin D deficiency Pt denies nausea, vomiting, and muscle weakness but notes fatigue. She is on prescription Vit D.  2. Other hyperlipidemia Pt has an LDL of 136, HDL 78, and triglycerides 67. She is not on meds. Pt is journaling and being mindful of intake.  Assessment/Plan:   1. Vitamin D deficiency Low Vitamin D level contributes to fatigue and are associated with obesity, breast, and colon cancer. She agrees to continue to take prescription Vitamin D @50 ,000 IU every week and will follow-up for routine testing of Vitamin D, at least 2-3 times per year to avoid over-replacement.  Refill- Vitamin D, Ergocalciferol, (DRISDOL) 1.25 MG (50000 UNIT) CAPS capsule; Take 1 capsule (50,000 Units total) by mouth every 7 (seven) days.  Dispense: 4 capsule; Refill: 0  2. Other hyperlipidemia Cardiovascular risk and specific lipid/LDL goals reviewed.  We discussed several lifestyle modifications today and Lexine will continue to work on diet, exercise and weight loss  efforts. Orders and follow up as documented in patient record. Continue current meal plan. Labs in April.  Counseling Intensive lifestyle modifications are the first line treatment for this issue. Dietary changes: Increase soluble fiber. Decrease simple carbohydrates. Exercise changes: Moderate to vigorous-intensity aerobic activity 150 minutes per week if tolerated. Lipid-lowering medications: see documented in medical record.  3. Obesity with current BMI of 29.8 Ashley [name] is currently in the action stage of change. As such, her goal is to continue with weight loss efforts. She has agreed to keeping a food journal and adhering to recommended goals of 1200-1500 calories and 85+ gr protein.   Exercise goals: All adults should avoid inactivity. Some physical activity is better than none, and adults who participate in any amount of physical activity gain some health benefits.  Behavioral modification strategies: increasing lean protein intake, meal planning and cooking strategies, planning for success, and keeping a strict food journal.  Ashley [name] has agreed to follow-up with our clinic in 4 weeks. She was informed of the importance of frequent follow-up visits to maximize her success with intensive lifestyle modifications for her multiple health conditions.   Objective:   Blood pressure 108/73, pulse 72, temperature 98.1 F (36.7 C), height 5\' 4"  (1.626 m), weight 173 lb (78.5 kg), last menstrual period 07/30/2021, SpO2 100 %. Body mass index is 29.7 kg/m.  General: Cooperative, alert, well developed, in no acute distress. HEENT: Conjunctivae and lids unremarkable. Cardiovascular: Regular rhythm.  Lungs: Normal work of breathing. Neurologic: No focal deficits.   Lab Results  Component Value Date   CREATININE 0.64 11/08/2016  BUN 9 11/08/2016   NA 136 11/08/2016   K 4.2 11/08/2016   CL 104 11/08/2016   CO2 26 11/08/2016   Lab Results  Component Value Date   ALT 18 11/08/2016   AST  23 11/08/2016   ALKPHOS 63 11/08/2016   BILITOT 0.4 11/08/2016   Lab Results  Component Value Date   HGBA1C 5.3 07/14/2021   HGBA1C 5.8 (H) 03/17/2015   Lab Results  Component Value Date   INSULIN 4.8 07/14/2021   Lab Results  Component Value Date   TSH 1.918 03/17/2015   Lab Results  Component Value Date   CHOL 226 (H) 07/14/2021   HDL 78 07/14/2021   LDLCALC 136 (H) 07/14/2021   TRIG 67 07/14/2021   Lab Results  Component Value Date   VD25OH 35.9 07/14/2021   Lab Results  Component Value Date   WBC 13.2 (H) 11/08/2016   HGB 12.3 11/08/2016   HCT 37.3 11/08/2016   MCV 83.4 11/08/2016   PLT 281 11/08/2016    Attestation Statements:   Reviewed by clinician on day of visit: allergies, medications, problem list, medical history, surgical history, family history, social history, and previous encounter notes.  Edmund Hilda, CMA, am acting as transcriptionist for Reuben Likes, MD.   I have reviewed the above documentation for accuracy and completeness, and I agree with the above. - Reuben Likes, MD

## 2021-10-10 ENCOUNTER — Encounter (INDEPENDENT_AMBULATORY_CARE_PROVIDER_SITE_OTHER): Payer: Self-pay | Admitting: Family Medicine

## 2021-10-10 ENCOUNTER — Ambulatory Visit (INDEPENDENT_AMBULATORY_CARE_PROVIDER_SITE_OTHER): Payer: 59 | Admitting: Family Medicine

## 2021-10-10 ENCOUNTER — Other Ambulatory Visit: Payer: Self-pay

## 2021-10-10 VITALS — BP 106/58 | HR 77 | Temp 98.1°F | Ht 64.0 in | Wt 174.0 lb

## 2021-10-10 DIAGNOSIS — E7849 Other hyperlipidemia: Secondary | ICD-10-CM

## 2021-10-10 DIAGNOSIS — E669 Obesity, unspecified: Secondary | ICD-10-CM | POA: Diagnosis not present

## 2021-10-10 DIAGNOSIS — Z6830 Body mass index (BMI) 30.0-30.9, adult: Secondary | ICD-10-CM

## 2021-10-10 DIAGNOSIS — E559 Vitamin D deficiency, unspecified: Secondary | ICD-10-CM

## 2021-10-10 DIAGNOSIS — Z6829 Body mass index (BMI) 29.0-29.9, adult: Secondary | ICD-10-CM | POA: Diagnosis not present

## 2021-10-10 MED ORDER — VITAMIN D (ERGOCALCIFEROL) 1.25 MG (50000 UNIT) PO CAPS: 50000.0000 [IU] | ORAL_CAPSULE | ORAL | 0 refills | Status: DC

## 2021-10-10 NOTE — Progress Notes (Signed)
? ? ? ?Chief Complaint:  ? ?OBESITY ?Ashley [name] is here to discuss her progress with her obesity treatment plan along with follow-up of her obesity related diagnoses. Ashley [name] is on keeping a food journal and adhering to recommended goals of 1200-1500 calories and 85 plus grams of protein and states she is following her eating plan approximately 80% of the time. Ashley [name] states she is walking for 10 minutes 6 times per week and skating for 30 minutes 5 times per week. ? ?Today's visit was #: 9 ?Starting weight: 176 lbs ?Starting date: 04/13/2021 ?Today's weight: 174 lbs ?Today's date: 10/10/2021 ?Total lbs lost to date: 2 lbs ?Total lbs lost since last in-office visit: 0 ? ?Interim History: Ashley [name] has had a decent last few weeks. She has added in skating and walking. She had a lapse in recording food on [employer] for a few weeks. She is back on logging food and wants to focus on work and getting in shape.  ? ?Subjective:  ? ?1. Vitamin D deficiency ?Ashley [name] is currently on prescription Vitamin D. Her last Vitamin D level was 35.9. She denies nausea, vomiting and muscle weakness. She notes fatigue.  ? ?2. Other hyperlipidemia ?Ashley [name] is not on statin currently. Her last LDL was 136. ? ?Assessment/Plan:  ? ?1. Vitamin D deficiency ?Low Vitamin D level contributes to fatigue and are associated with obesity, breast, and colon cancer. We will refill prescription Vitamin D 50,000 IU every week for 1 month with no refills and Ashley [name] will follow-up for routine testing of Vitamin D, at least 2-3 times per year to avoid over-replacement. ? ?- Vitamin D, Ergocalciferol, (DRISDOL) 1.25 MG (50000 UNIT) CAPS capsule; Take 1 capsule (50,000 Units total) by mouth every 7 (seven) days.  Dispense: 4 capsule; Refill: 0 ? ?2. Other hyperlipidemia ?Cardiovascular risk and specific lipid/LDL goals reviewed.  We will repeat Ashley [name]'s labs in May. We discussed several lifestyle modifications today and Ashley [name] will continue to work on diet,  exercise and weight loss efforts. Orders and follow up as documented in patient record.  ? ?Counseling ?Intensive lifestyle modifications are the first line treatment for this issue. ?Dietary changes: Increase soluble fiber. Decrease simple carbohydrates. ?Exercise changes: Moderate to vigorous-intensity aerobic activity 150 minutes per week if tolerated. ?Lipid-lowering medications: see documented in medical record. ? ?3. Obesity with current BMI of 29.9 ?Ashley [name] is currently in the action stage of change. As such, her goal is to continue with weight loss efforts. She has agreed to keeping a food journal and adhering to recommended goals of 1200-1500 calories and 85 plus grams of protein daily.   ? ?Exercise goals:  As is. ? ?Behavioral modification strategies: increasing lean protein intake, meal planning and cooking strategies, keeping healthy foods in the home, better snacking choices, and planning for success. ? ?Ashley [name] has agreed to follow-up with our clinic in 3-4 weeks. She was informed of the importance of frequent follow-up visits to maximize her success with intensive lifestyle modifications for her multiple health conditions.  ? ?Objective:  ? ?Blood pressure (!) 106/58, pulse 77, temperature 98.1 ?F (36.7 ?C), height 5\' 4"  (1.626 m), weight 174 lb (78.9 kg), last menstrual period 09/20/2021, SpO2 100 %. ?Body mass index is 29.87 kg/m?. ? ?General: Cooperative, alert, well developed, in no acute distress. ?HEENT: Conjunctivae and lids unremarkable. ?Cardiovascular: Regular rhythm.  ?Lungs: Normal work of breathing. ?Neurologic: No focal deficits.  ? ?Lab Results  ?Component Value Date  ? CREATININE 0.64 11/08/2016  ? BUN 9 11/08/2016  ?  NA 136 11/08/2016  ? K 4.2 11/08/2016  ? CL 104 11/08/2016  ? CO2 26 11/08/2016  ? ?Lab Results  ?Component Value Date  ? ALT 18 11/08/2016  ? AST 23 11/08/2016  ? ALKPHOS 63 11/08/2016  ? BILITOT 0.4 11/08/2016  ? ?Lab Results  ?Component Value Date  ? HGBA1C 5.3  07/14/2021  ? HGBA1C 5.8 (H) 03/17/2015  ? ?Lab Results  ?Component Value Date  ? INSULIN 4.8 07/14/2021  ? ?Lab Results  ?Component Value Date  ? TSH 1.918 03/17/2015  ? ?Lab Results  ?Component Value Date  ? CHOL 226 (H) 07/14/2021  ? HDL 78 07/14/2021  ? LDLCALC 136 (H) 07/14/2021  ? TRIG 67 07/14/2021  ? ?Lab Results  ?Component Value Date  ? VD25OH 35.9 07/14/2021  ? ?Lab Results  ?Component Value Date  ? WBC 13.2 (H) 11/08/2016  ? HGB 12.3 11/08/2016  ? HCT 37.3 11/08/2016  ? MCV 83.4 11/08/2016  ? PLT 281 11/08/2016  ? ?No results found for: IRON, TIBC, FERRITIN ? ?Attestation Statements:  ? ?Reviewed by clinician on day of visit: allergies, medications, problem list, medical history, surgical history, family history, social history, and previous encounter notes. ? ?I, Lizbeth Bark, RMA, am acting as transcriptionist for Coralie Common, MD ? ?I have reviewed the above documentation for accuracy and completeness, and I agree with the above. Coralie Common, MD ? ?

## 2021-10-31 ENCOUNTER — Encounter (INDEPENDENT_AMBULATORY_CARE_PROVIDER_SITE_OTHER): Payer: Self-pay | Admitting: Physician Assistant

## 2021-10-31 ENCOUNTER — Other Ambulatory Visit: Payer: Self-pay

## 2021-10-31 ENCOUNTER — Ambulatory Visit (INDEPENDENT_AMBULATORY_CARE_PROVIDER_SITE_OTHER): Payer: 59 | Admitting: Physician Assistant

## 2021-10-31 VITALS — BP 113/71 | HR 86 | Temp 98.2°F | Ht 64.0 in | Wt 174.0 lb

## 2021-10-31 DIAGNOSIS — E559 Vitamin D deficiency, unspecified: Secondary | ICD-10-CM

## 2021-10-31 DIAGNOSIS — Z9189 Other specified personal risk factors, not elsewhere classified: Secondary | ICD-10-CM | POA: Diagnosis not present

## 2021-10-31 DIAGNOSIS — E669 Obesity, unspecified: Secondary | ICD-10-CM

## 2021-10-31 DIAGNOSIS — Z6829 Body mass index (BMI) 29.0-29.9, adult: Secondary | ICD-10-CM

## 2021-10-31 MED ORDER — VITAMIN D (ERGOCALCIFEROL) 1.25 MG (50000 UNIT) PO CAPS: 50000.0000 [IU] | ORAL_CAPSULE | ORAL | 0 refills | Status: DC

## 2021-10-31 NOTE — Progress Notes (Signed)
? ? ? ?Chief Complaint:  ? ?OBESITY ?Ashley [name] is here to discuss her progress with her obesity treatment plan along with follow-up of her obesity related diagnoses. Ashley [name] is on keeping a food journal and adhering to recommended goals of 1200-1500 calories and 85 grams of protein and states she is following her eating plan approximately 50% of the time. Ashley [name] states she is skating, using a stationary bike, and walking for 30-50 minutes 6 times per week. ? ?Today's visit was #: 10 ?Starting weight: 176 lbs ?Starting date: 04/13/2021 ?Today's weight: 174 lbs ?Today's date: 10/31/2021 ?Total lbs lost to date: 2 lbs ?Total lbs lost since last in-office visit: 0 ? ?Interim History: The last few weeks she has been eating more fried foods and "higher calories foods". She has not journaled. She is not drinking enough water.  ? ?Subjective:  ? ?1. Vitamin D deficiency ?Ashley [name] is on Vitamin D currently. She denies nausea, vomiting, and muscle weakness.  ? ?2. At risk for osteoporosis ?Ashley [name] is at higher risk of osteopenia and osteoporosis due to Vitamin D deficiency.   ? ?Assessment/Plan:  ? ?1. Vitamin D deficiency ?Low Vitamin D level contributes to fatigue and are associated with obesity, breast, and colon cancer. We will refill prescription Vitamin D 50,000 IU every week for 1 month with no refills and Ashley [name] will follow-up for routine testing of Vitamin D, at least 2-3 times per year to avoid over-replacement. ? ?- Vitamin D, Ergocalciferol, (DRISDOL) 1.25 MG (50000 UNIT) CAPS capsule; Take 1 capsule (50,000 Units total) by mouth every 7 (seven) days.  Dispense: 4 capsule; Refill: 0 ? ?2. At risk for osteoporosis ?Ashley [name] was given approximately 15 minutes of osteoporosis prevention counseling today. Ashley [name] is at risk for osteopenia and osteoporosis due to her Vitamin D deficiency. She was encouraged to take her Vit D and follow her higher calcium diet and increase strengthening exercise to help strengthen her bones  and decrease her risk of osteopenia and osteoporosis.  ? ?3. Obesity with current BMI of 29.9 ?Ashley [name] is currently in the action stage of change. As such, her goal is to continue with weight loss efforts. She has agreed to keeping a food journal and adhering to recommended goals of 1200-1500 calories and 85 grams of protein daily.  ? ?Exercise goals:  As is. ? ?Behavioral modification strategies: increasing lean protein intake, increasing water intake, and meal planning and cooking strategies. ? ?Ashley [name] has agreed to follow-up with our clinic in 2 weeks. She was informed of the importance of frequent follow-up visits to maximize her success with intensive lifestyle modifications for her multiple health conditions.  ? ?Objective:  ? ?Blood pressure 113/71, pulse 86, temperature 98.2 ?F (36.8 ?C), height 5\' 4"  (1.626 m), weight 174 lb (78.9 kg), SpO2 100 %. ?Body mass index is 29.87 kg/m?. ? ?General: Cooperative, alert, well developed, in no acute distress. ?HEENT: Conjunctivae and lids unremarkable. ?Cardiovascular: Regular rhythm.  ?Lungs: Normal work of breathing. ?Neurologic: No focal deficits.  ? ?Lab Results  ?Component Value Date  ? CREATININE 0.64 11/08/2016  ? BUN 9 11/08/2016  ? NA 136 11/08/2016  ? K 4.2 11/08/2016  ? CL 104 11/08/2016  ? CO2 26 11/08/2016  ? ?Lab Results  ?Component Value Date  ? ALT 18 11/08/2016  ? AST 23 11/08/2016  ? ALKPHOS 63 11/08/2016  ? BILITOT 0.4 11/08/2016  ? ?Lab Results  ?Component Value Date  ? HGBA1C 5.3 07/14/2021  ? HGBA1C 5.8 (H) 03/17/2015  ? ?Lab Results  ?  Component Value Date  ? INSULIN 4.8 07/14/2021  ? ?Lab Results  ?Component Value Date  ? TSH 1.918 03/17/2015  ? ?Lab Results  ?Component Value Date  ? CHOL 226 (H) 07/14/2021  ? HDL 78 07/14/2021  ? LDLCALC 136 (H) 07/14/2021  ? TRIG 67 07/14/2021  ? ?Lab Results  ?Component Value Date  ? VD25OH 35.9 07/14/2021  ? ?Lab Results  ?Component Value Date  ? WBC 13.2 (H) 11/08/2016  ? HGB 12.3 11/08/2016  ? HCT 37.3  11/08/2016  ? MCV 83.4 11/08/2016  ? PLT 281 11/08/2016  ? ?No results found for: IRON, TIBC, FERRITIN ? ?Attestation Statements:  ? ?Reviewed by clinician on day of visit: allergies, medications, problem list, medical history, surgical history, family history, social history, and previous encounter notes. ? ?I, Sindy Messing, am acting as Energy manager for [employer], PA-C. ? ?I have reviewed the above documentation for accuracy and completeness, and I agree with the above. Alois Cliche, PA-C ? ?

## 2021-11-17 ENCOUNTER — Ambulatory Visit (INDEPENDENT_AMBULATORY_CARE_PROVIDER_SITE_OTHER): Payer: 59 | Admitting: Family Medicine

## 2021-11-17 ENCOUNTER — Encounter (INDEPENDENT_AMBULATORY_CARE_PROVIDER_SITE_OTHER): Payer: Self-pay | Admitting: Family Medicine

## 2021-11-17 VITALS — BP 106/69 | HR 76 | Temp 98.0°F | Ht 64.0 in | Wt 173.0 lb

## 2021-11-17 DIAGNOSIS — R7303 Prediabetes: Secondary | ICD-10-CM | POA: Diagnosis not present

## 2021-11-17 DIAGNOSIS — E669 Obesity, unspecified: Secondary | ICD-10-CM | POA: Diagnosis not present

## 2021-11-17 DIAGNOSIS — E559 Vitamin D deficiency, unspecified: Secondary | ICD-10-CM | POA: Diagnosis not present

## 2021-11-17 DIAGNOSIS — Z6829 Body mass index (BMI) 29.0-29.9, adult: Secondary | ICD-10-CM

## 2021-11-17 MED ORDER — VITAMIN D (ERGOCALCIFEROL) 1.25 MG (50000 UNIT) PO CAPS: 50000.0000 [IU] | ORAL_CAPSULE | ORAL | 0 refills | Status: DC

## 2021-11-24 NOTE — Progress Notes (Signed)
? ? ? ?Chief Complaint:  ? ?OBESITY ?Ashley [name] is here to discuss her progress with her obesity treatment plan along with follow-up of her obesity related diagnoses. Ashley [name] is on keeping a food journal and adhering to recommended goals of 1200-1500 calories and 85 protein daily and states she is following her eating plan approximately 50% of the time. Ashley [name] states she is riding the bike for 30 minutes, walking for 10 minutes, skate or line dancing for 30-45 minutes 5 times per week. ? ?Today's visit was #: 11 ?Starting weight: 176 lbs ?Starting date: 04/13/2021 ?Today's weight: 173 lbs ?Today's date: 11/17/2021 ?Total lbs lost to date: 3 ?Total lbs lost since last in-office visit: 1 ? ?Interim History: Ashley [name] has been increasing protein and working on increasing exercise. She has been trying to be mindful and decrease sweet/carbs. Averaging around 60-65 grams of protein daily. Getting 1250-1300 calories daily. Has 2 dances, 2 banquets and vacation to [place] in April (end of the month). Doing some health related activities with church.  ? ?Subjective:  ? ?1. Vitamin D deficiency ?Ashley [name] is on prescription Vitamin D. Last Vit D level was of 35.9. ? ?2. Prediabetes ?Ashley [name]'s last A1c was controlled at 5.3 and insulin 4.8. She is not on medications.  ? ?Assessment/Plan:  ? ?1. Vitamin D deficiency ?We will refill to take prescription Vitamin D 50,000 IU every week for 1 month. Ashley [name] will follow-up for routine testing of Vitamin D, at least 2-3 times per year to avoid over-replacement. ? ?- Vitamin D, Ergocalciferol, (DRISDOL) 1.25 MG (50000 UNIT) CAPS capsule; Take 1 capsule (50,000 Units total) by mouth every 7 (seven) days.  Dispense: 4 capsule; Refill: 0 ? ?2. Prediabetes ?We will follow up on labs in May or June.  ? ?3. Obesity with current BMI of 29.7 ?Ashley [name] is currently in the action stage of change. As such, her goal is to continue with weight loss efforts. She has agreed to keeping a food journal and  adhering to recommended goals of 1200-1400 calories and 85+ grams of protein daily.  ? ?Exercise goals: As is. ? ?Behavioral modification strategies: increasing lean protein intake, meal planning and cooking strategies, and keeping healthy foods in the home. ? ?Ashley [name] has agreed to follow-up with our clinic in 4 weeks. She was informed of the importance of frequent follow-up visits to maximize her success with intensive lifestyle modifications for her multiple health conditions.  ? ?Objective:  ? ?Blood pressure 106/69, pulse 76, temperature 98 ?F (36.7 ?C), height 5\' 4"  (1.626 m), weight 173 lb (78.5 kg), SpO2 100 %. ?Body mass index is 29.7 kg/m?. ? ?General: Cooperative, alert, well developed, in no acute distress. ?HEENT: Conjunctivae and lids unremarkable. ?Cardiovascular: Regular rhythm.  ?Lungs: Normal work of breathing. ?Neurologic: No focal deficits.  ? ?Lab Results  ?Component Value Date  ? CREATININE 0.64 11/08/2016  ? BUN 9 11/08/2016  ? NA 136 11/08/2016  ? K 4.2 11/08/2016  ? CL 104 11/08/2016  ? CO2 26 11/08/2016  ? ?Lab Results  ?Component Value Date  ? ALT 18 11/08/2016  ? AST 23 11/08/2016  ? ALKPHOS 63 11/08/2016  ? BILITOT 0.4 11/08/2016  ? ?Lab Results  ?Component Value Date  ? HGBA1C 5.3 07/14/2021  ? HGBA1C 5.8 (H) 03/17/2015  ? ?Lab Results  ?Component Value Date  ? INSULIN 4.8 07/14/2021  ? ?Lab Results  ?Component Value Date  ? TSH 1.918 03/17/2015  ? ?Lab Results  ?Component Value Date  ? CHOL 226 (H) 07/14/2021  ?  HDL 78 07/14/2021  ? LDLCALC 136 (H) 07/14/2021  ? TRIG 67 07/14/2021  ? ?Lab Results  ?Component Value Date  ? VD25OH 35.9 07/14/2021  ? ?Lab Results  ?Component Value Date  ? WBC 13.2 (H) 11/08/2016  ? HGB 12.3 11/08/2016  ? HCT 37.3 11/08/2016  ? MCV 83.4 11/08/2016  ? PLT 281 11/08/2016  ? ?No results found for: IRON, TIBC, FERRITIN ? ?Attestation Statements:  ? ?Reviewed by clinician on day of visit: allergies, medications, problem list, medical history, surgical history,  family history, social history, and previous encounter notes. ? ? ?I, Burt Knack, am acting as transcriptionist for Reuben Likes, MD. ? ?I have reviewed the above documentation for accuracy and completeness, and I agree with the above. Reuben Likes, MD ? ? ?

## 2021-12-15 ENCOUNTER — Ambulatory Visit (INDEPENDENT_AMBULATORY_CARE_PROVIDER_SITE_OTHER): Payer: 59 | Admitting: Family Medicine

## 2022-01-05 ENCOUNTER — Encounter (INDEPENDENT_AMBULATORY_CARE_PROVIDER_SITE_OTHER): Payer: Self-pay | Admitting: Family Medicine

## 2022-01-05 ENCOUNTER — Ambulatory Visit (INDEPENDENT_AMBULATORY_CARE_PROVIDER_SITE_OTHER): Payer: 59 | Admitting: Family Medicine

## 2022-01-05 VITALS — BP 101/63 | HR 79 | Temp 98.2°F | Ht 64.0 in | Wt 175.0 lb

## 2022-01-05 DIAGNOSIS — Z9189 Other specified personal risk factors, not elsewhere classified: Secondary | ICD-10-CM

## 2022-01-05 DIAGNOSIS — E559 Vitamin D deficiency, unspecified: Secondary | ICD-10-CM

## 2022-01-05 DIAGNOSIS — E669 Obesity, unspecified: Secondary | ICD-10-CM

## 2022-01-05 DIAGNOSIS — R7303 Prediabetes: Secondary | ICD-10-CM | POA: Diagnosis not present

## 2022-01-05 DIAGNOSIS — Z6830 Body mass index (BMI) 30.0-30.9, adult: Secondary | ICD-10-CM

## 2022-01-05 DIAGNOSIS — R632 Polyphagia: Secondary | ICD-10-CM | POA: Diagnosis not present

## 2022-01-05 DIAGNOSIS — E7849 Other hyperlipidemia: Secondary | ICD-10-CM

## 2022-01-05 MED ORDER — LOMAIRA 8 MG PO TABS: 4.0000 mg | ORAL_TABLET | Freq: Every day | ORAL | 0 refills | Status: DC

## 2022-01-05 MED ORDER — TOPIRAMATE 25 MG PO TABS: 25.0000 mg | ORAL_TABLET | Freq: Every day | ORAL | 0 refills | Status: DC

## 2022-01-06 LAB — COMPREHENSIVE METABOLIC PANEL
ALT: 15 IU/L (ref 0–32)
AST: 20 IU/L (ref 0–40)
Albumin/Globulin Ratio: 1.4 (ref 1.2–2.2)
Albumin: 4.1 g/dL (ref 3.8–4.8)
Alkaline Phosphatase: 84 IU/L (ref 44–121)
BUN/Creatinine Ratio: 19 (ref 9–23)
BUN: 13 mg/dL (ref 6–24)
Bilirubin Total: 0.4 mg/dL (ref 0.0–1.2)
CO2: 20 mmol/L (ref 20–29)
Calcium: 8.8 mg/dL (ref 8.7–10.2)
Chloride: 103 mmol/L (ref 96–106)
Creatinine, Ser: 0.67 mg/dL (ref 0.57–1.00)
Globulin, Total: 2.9 g/dL (ref 1.5–4.5)
Glucose: 72 mg/dL (ref 70–99)
Potassium: 4.4 mmol/L (ref 3.5–5.2)
Sodium: 138 mmol/L (ref 134–144)
Total Protein: 7 g/dL (ref 6.0–8.5)
eGFR: 110 mL/min/{1.73_m2} (ref >59)

## 2022-01-06 LAB — LIPID PANEL WITH LDL/HDL RATIO
Cholesterol, Total: 248 mg/dL — ABNORMAL HIGH (ref 100–199)
HDL: 84 mg/dL (ref >39)
LDL Chol Calc (NIH): 152 mg/dL — ABNORMAL HIGH (ref 0–99)
LDL/HDL Ratio: 1.8 ratio (ref 0.0–3.2)
Triglycerides: 73 mg/dL (ref 0–149)
VLDL Cholesterol Cal: 12 mg/dL (ref 5–40)

## 2022-01-06 LAB — HEMOGLOBIN A1C
Est. average glucose Bld gHb Est-mCnc: 105 mg/dL
Hgb A1c MFr Bld: 5.3 % (ref 4.8–5.6)

## 2022-01-06 LAB — INSULIN, RANDOM: INSULIN: 7 u[IU]/mL (ref 2.6–24.9)

## 2022-01-06 LAB — VITAMIN D 25 HYDROXY (VIT D DEFICIENCY, FRACTURES): Vit D, 25-Hydroxy: 47.8 ng/mL (ref 30.0–100.0)

## 2022-01-10 NOTE — Progress Notes (Signed)
Chief Complaint:   OBESITY Ashley [name] is here to discuss her progress with her obesity treatment plan along with follow-up of her obesity related diagnoses. Ashley [name] is on keeping a food journal and adhering to recommended goals of 1200-1400 calories and 85 grams of protein and states she is following her eating plan approximately 0% of the time. Ashley [name] states she is exercising 40 minutes 5 times per week.  Today's visit was #: 12 Starting weight: 176 lbs Starting date: 04/13/2021 Today's weight: 175 lbs Today's date: 01/05/2022 Total lbs lost to date: 1 lb Total lbs lost since last in-office visit: 0  Interim History: Ashley [name] was rescheduled due to provider illness. She tried going to fruit, vegetable, protein and noticed a weight increase. Feeling frustrated, she increased exercise to 5 times a week. Ashley [name] wants to stay focused on protein intake.  Subjective:   1. Polyphagia Ashley [name] still not always able to get all nutrition in but is trying to decrease carb intake but increase protein. PDMP ( prescription drug monitoring program) checked. Controlled substance Contract signed.  2. Vitamin D deficiency Ashley [name] is currently taking prescription Vit D 50,000 IU once a week. Denies any nausea, vomiting or muscle weakness. She notes fatigue.  3. Other hyperlipidemia Ashley [name]'s last LDL 136, HDL 78 and Trigly 67. She is not currently on medication.  4. Prediabetes Ashley [name]'s last A1c at 5.3, insulin at 4.8. She is not currently taking any medication.   5. At risk for side effect of medication Ashley [name] is at risk for drug side effects due to starting medications.   Assessment/Plan:   1. Polyphagia START Phentermine 8 mg daily and Topiramate 25 mg by mouth daily. Fill for 1 month with 0 refills.  -START Phentermine HCl (LOMAIRA) 8 MG TABS; Take 4 mg by mouth daily.  Dispense: 30 tablet; Refill: 0  -START topiramate (TOPAMAX) 25 MG tablet; Take 1 tablet (25 mg total) by mouth daily.   Dispense: 45 tablet; Refill: 0  2. Vitamin D deficiency We will obtain Vit D level today.  - VITAMIN D 25 Hydroxy (Vit-D Deficiency, Fractures)  3. Other hyperlipidemia We will obtain FLP today.  - Lipid Panel With LDL/HDL Ratio  4. Prediabetes We will obtain A1c, CMP and insulin level today.  - Comprehensive metabolic panel - Hemoglobin A1c - Insulin, random  5. At risk for side effect of medication Ashley [name] was given approximately 15 minutes of drug side effect counseling today.  We discussed side effect possibility and risk versus benefits. Ashley [name] agreed to the medication and will contact this office if these side effects are intolerable.  Repetitive spaced learning was employed today to elicit superior memory formation and behavioral change.  6. Obesity with current BMI of 30.1 Analycia is currently in the action stage of change. As such, her goal is to continue with weight loss efforts. She has agreed to practicing portion control and making smarter food choices, such as increasing vegetables and decreasing simple carbohydrates.   Exercise goals: All adults should avoid inactivity. Some physical activity is better than none, and adults who participate in any amount of physical activity gain some health benefits.  Behavioral modification strategies: increasing lean protein intake, meal planning and cooking strategies, keeping healthy foods in the home, and planning for success.  Ashley [name] has agreed to follow-up with our clinic in 3 weeks. She was informed of the importance of frequent follow-up visits to maximize her success with intensive lifestyle modifications for her multiple health conditions.   Ashley [name]  was informed we would discuss her lab results at her next visit unless there is a critical issue that needs to be addressed sooner. Ashley [name] agreed to keep her next visit at the agreed upon time to discuss these results.  Objective:   Blood pressure 101/63, pulse 79,  temperature 98.2 F (36.8 C), height 5\' 4"  (1.626 m), weight 175 lb (79.4 kg), SpO2 100 %. Body mass index is 30.04 kg/m.  General: Cooperative, alert, well developed, in no acute distress. HEENT: Conjunctivae and lids unremarkable. Cardiovascular: Regular rhythm.  Lungs: Normal work of breathing. Neurologic: No focal deficits.   Lab Results  Component Value Date   CREATININE 0.67 01/05/2022   BUN 13 01/05/2022   NA 138 01/05/2022   K 4.4 01/05/2022   CL 103 01/05/2022   CO2 20 01/05/2022   Lab Results  Component Value Date   ALT 15 01/05/2022   AST 20 01/05/2022   ALKPHOS 84 01/05/2022   BILITOT 0.4 01/05/2022   Lab Results  Component Value Date   HGBA1C 5.3 01/05/2022   HGBA1C 5.3 07/14/2021   HGBA1C 5.8 (H) 03/17/2015   Lab Results  Component Value Date   INSULIN 7.0 01/05/2022   INSULIN 4.8 07/14/2021   Lab Results  Component Value Date   TSH 1.918 03/17/2015   Lab Results  Component Value Date   CHOL 248 (H) 01/05/2022   HDL 84 01/05/2022   LDLCALC 152 (H) 01/05/2022   TRIG 73 01/05/2022   Lab Results  Component Value Date   VD25OH 47.8 01/05/2022   VD25OH 35.9 07/14/2021   Lab Results  Component Value Date   WBC 13.2 (H) 11/08/2016   HGB 12.3 11/08/2016   HCT 37.3 11/08/2016   MCV 83.4 11/08/2016   PLT 281 11/08/2016   No results found for: IRON, TIBC, FERRITIN  Attestation Statements:   Reviewed by clinician on day of visit: allergies, medications, problem list, medical history, surgical history, family history, social history, and previous encounter notes.  I, 01/08/2017, RMA am acting as transcriptionist for Fortino Sic, MD.  I have reviewed the above documentation for accuracy and completeness, and I agree with the above. - Reuben Likes, MD

## 2022-01-30 ENCOUNTER — Ambulatory Visit (INDEPENDENT_AMBULATORY_CARE_PROVIDER_SITE_OTHER): Payer: 59 | Admitting: Family Medicine

## 2022-01-30 ENCOUNTER — Encounter (INDEPENDENT_AMBULATORY_CARE_PROVIDER_SITE_OTHER): Payer: Self-pay | Admitting: Family Medicine

## 2022-01-30 VITALS — BP 94/60 | HR 74 | Temp 98.2°F | Ht 64.0 in | Wt 173.0 lb

## 2022-01-30 DIAGNOSIS — R632 Polyphagia: Secondary | ICD-10-CM

## 2022-01-30 DIAGNOSIS — Z6829 Body mass index (BMI) 29.0-29.9, adult: Secondary | ICD-10-CM

## 2022-01-30 DIAGNOSIS — E669 Obesity, unspecified: Secondary | ICD-10-CM | POA: Diagnosis not present

## 2022-01-30 DIAGNOSIS — E559 Vitamin D deficiency, unspecified: Secondary | ICD-10-CM

## 2022-01-30 DIAGNOSIS — Z9189 Other specified personal risk factors, not elsewhere classified: Secondary | ICD-10-CM

## 2022-01-30 MED ORDER — VITAMIN D3 125 MCG (5000 UT) PO CAPS: 5000.0000 [IU] | ORAL_CAPSULE | Freq: Every day | ORAL | 0 refills | Status: DC

## 2022-01-30 MED ORDER — LOMAIRA 8 MG PO TABS: 12.0000 mg | ORAL_TABLET | Freq: Every day | ORAL | 0 refills | Status: DC

## 2022-01-30 MED ORDER — TOPIRAMATE 50 MG PO TABS: 75.0000 mg | ORAL_TABLET | Freq: Every day | ORAL | 0 refills | Status: DC

## 2022-02-01 NOTE — Progress Notes (Signed)
Chief Complaint:   OBESITY Ashley [name] is here to discuss her progress with her obesity treatment plan along with follow-up of her obesity related diagnoses. Ashley [name] is on practicing portion control and making smarter food choices, such as increasing vegetables and decreasing simple carbohydrates and states she is following her eating plan approximately 100% of the time. Ashley [name] states she is walking 30 minutes 1 times per week.  Today's visit was #: 13 Starting weight: 176 lbs Starting date: 04/13/2021 Today's weight: 173 lbs Today's date: 01/30/2022 Total lbs lost to date: 3 lbs Total lbs lost since last in-office visit: 2  Interim History: Ashley [name] has started combo Phentermine and Topiramate combination. Definitively feeling appetite curbing. She is also able to drink more water. She is eating out frequently due to birthday celebrations. She is not anticipating eating out as much due to celebrations being over.  Subjective:   1. Polyphagia Ashley [name] is currently on combo Lomaira and Topiramate and has done well on combo. Her starting weight was 175 lbs. PDMP ( prescription drug monitoring program) checked.  2. Vitamin D deficiency Ashley [name] has recently stopped prescription Vit D due to Vit D level on recent labs. Her Vit D level of 47.8  3. At risk for side effect of medication Ashley [name] is at risk for drug side effects due to increase dose of Qsymia.  Assessment/Plan:   1. Polyphagia We will refill/increase Topiramate to 75 mg by mouth daily and increase Phentermine to 12 mg by mouth daily for 1 month with 0 refills.  -Refill topiramate (TOPAMAX) 50 MG tablet; Take 1.5 tablets (75 mg total) by mouth daily.  Dispense: 45 tablet; Refill: 0  -Refill Phentermine HCl (LOMAIRA) 8 MG TABS; Take 12 mg by mouth daily.  Dispense: 45 tablet; Refill: 0  2. Vitamin D deficiency We will refill Vit D 5000 IU daily for 1 month with 0 refills.  -Refill Cholecalciferol (VITAMIN D3) 125 MCG (5000  UT) CAPS; Take 1 capsule (5,000 Units total) by mouth daily.  Dispense: 30 capsule; Refill: 0  3. At risk for side effect of medication Ashley [name] was given approximately 15 minutes of drug side effect counseling today.  We discussed side effect possibility and risk versus benefits. Ashley [name] agreed to the medication and will contact this office if these side effects are intolerable.  Repetitive spaced learning was employed today to elicit superior memory formation and behavioral change.  4. Obesity with current BMI of 29.7 Ashley [name] is currently in the action stage of change. As such, her goal is to continue with weight loss efforts. She has agreed to practicing portion control and making smarter food choices, such as increasing vegetables and decreasing simple carbohydrates.   Exercise goals: All adults should avoid inactivity. Some physical activity is better than none, and adults who participate in any amount of physical activity gain some health benefits.  Ashley [name] will increase activity to 3 times a week.  Behavioral modification strategies: increasing lean protein intake, decreasing eating out, meal planning and cooking strategies, keeping healthy foods in the home, and planning for success.  Ashley [name] has agreed to follow-up with our clinic in 4 weeks. She was informed of the importance of frequent follow-up visits to maximize her success with intensive lifestyle modifications for her multiple health conditions.   Objective:   Blood pressure 94/60, pulse 74, temperature 98.2 F (36.8 C), height 5\' 4"  (1.626 m), weight 173 lb (78.5 kg), SpO2 98 %. Body mass index is 29.7 kg/m.  General: Cooperative, alert,  well developed, in no acute distress. HEENT: Conjunctivae and lids unremarkable. Cardiovascular: Regular rhythm.  Lungs: Normal work of breathing. Neurologic: No focal deficits.   Lab Results  Component Value Date   CREATININE 0.67 01/05/2022   BUN 13 01/05/2022   NA 138 01/05/2022    K 4.4 01/05/2022   CL 103 01/05/2022   CO2 20 01/05/2022   Lab Results  Component Value Date   ALT 15 01/05/2022   AST 20 01/05/2022   ALKPHOS 84 01/05/2022   BILITOT 0.4 01/05/2022   Lab Results  Component Value Date   HGBA1C 5.3 01/05/2022   HGBA1C 5.3 07/14/2021   HGBA1C 5.8 (H) 03/17/2015   Lab Results  Component Value Date   INSULIN 7.0 01/05/2022   INSULIN 4.8 07/14/2021   Lab Results  Component Value Date   TSH 1.918 03/17/2015   Lab Results  Component Value Date   CHOL 248 (H) 01/05/2022   HDL 84 01/05/2022   LDLCALC 152 (H) 01/05/2022   TRIG 73 01/05/2022   Lab Results  Component Value Date   VD25OH 47.8 01/05/2022   VD25OH 35.9 07/14/2021   Lab Results  Component Value Date   WBC 13.2 (H) 11/08/2016   HGB 12.3 11/08/2016   HCT 37.3 11/08/2016   MCV 83.4 11/08/2016   PLT 281 11/08/2016   No results found for: "IRON", "TIBC", "FERRITIN"  Attestation Statements:   Reviewed by clinician on day of visit: allergies, medications, problem list, medical history, surgical history, family history, social history, and previous encounter notes.  I, Fortino Sic, RMA am acting as transcriptionist for Reuben Likes, MD. I have reviewed the above documentation for accuracy and completeness, and I agree with the above. - Reuben Likes, MD

## 2022-02-07 ENCOUNTER — Encounter (INDEPENDENT_AMBULATORY_CARE_PROVIDER_SITE_OTHER): Payer: Self-pay | Admitting: Family Medicine

## 2022-02-08 NOTE — Telephone Encounter (Signed)
Will call pharmacy.

## 2022-03-02 ENCOUNTER — Other Ambulatory Visit (INDEPENDENT_AMBULATORY_CARE_PROVIDER_SITE_OTHER): Payer: Self-pay | Admitting: Family Medicine

## 2022-03-02 DIAGNOSIS — R632 Polyphagia: Secondary | ICD-10-CM

## 2022-03-08 ENCOUNTER — Ambulatory Visit (INDEPENDENT_AMBULATORY_CARE_PROVIDER_SITE_OTHER): Payer: 59 | Admitting: Family Medicine

## 2022-03-08 ENCOUNTER — Encounter (INDEPENDENT_AMBULATORY_CARE_PROVIDER_SITE_OTHER): Payer: Self-pay | Admitting: Family Medicine

## 2022-03-08 VITALS — BP 112/65 | HR 87 | Temp 98.3°F | Ht 64.0 in | Wt 169.0 lb

## 2022-03-08 DIAGNOSIS — E7849 Other hyperlipidemia: Secondary | ICD-10-CM

## 2022-03-08 DIAGNOSIS — E669 Obesity, unspecified: Secondary | ICD-10-CM

## 2022-03-08 DIAGNOSIS — R632 Polyphagia: Secondary | ICD-10-CM | POA: Diagnosis not present

## 2022-03-08 DIAGNOSIS — Z6829 Body mass index (BMI) 29.0-29.9, adult: Secondary | ICD-10-CM | POA: Diagnosis not present

## 2022-03-08 MED ORDER — TOPIRAMATE 50 MG PO TABS: 75.0000 mg | ORAL_TABLET | Freq: Every day | ORAL | 0 refills | Status: DC

## 2022-03-08 MED ORDER — LOMAIRA 8 MG PO TABS: 12.0000 mg | ORAL_TABLET | Freq: Every day | ORAL | 0 refills | Status: DC

## 2022-03-13 NOTE — Progress Notes (Signed)
Chief Complaint:   OBESITY Ashley [name] is here to discuss her progress with her obesity treatment plan along with follow-up of her obesity related diagnoses. Ashley [name] is on practicing portion control and making smarter food choices, such as increasing vegetables and decreasing simple carbohydrates and states she is following her eating plan approximately 80% of the time. Ashley [name] states she is working out 10-30 minutes 4-5 times per week.  Today's visit was #: 14 Starting weight: 176 lbs Starting date: 04/13/2021 Today's weight: 169 lbs Today's date: 03/08/2022 Total lbs lost to date: 7 lbs Total lbs lost since last in-office visit: 4  Interim History: Ashley [name] is planning to go to [employer] for [employer]. She does anticipate more time for herself. Wondering if she is getting all food in. Tries to stay mindful while traveling of protein intake she is getting in.  Subjective:   1. Polyphagia Kylan ran out of Topamax and Lomaira 4 days ago. !st treatment dose of makeshift Qsymia started on 6/30. PDMP (prescription drug monitoring program) checked. Down 4 lbs( need 5 lbs more by the end of Sept).  2. Other hyperlipidemia Chaise's LDL at 152, HDL at 84, and Trigly at 73.  Assessment/Plan:   1. Polyphagia We will refill Topamax 75 mg by mouth daily AND Lomaira 12 mg by mouth daily for 1 month with 0 refills.  -Refill topiramate (TOPAMAX) 50 MG tablet; Take 1.5 tablets (75 mg total) by mouth daily.  Dispense: 45 tablet; Refill: 0  -Refill Phentermine HCl (LOMAIRA) 8 MG TABS; Take 12 mg by mouth daily.  Dispense: 45 tablet; Refill: 0  2. Other hyperlipidemia Will repeat labs in October.  3. Obesity with current BMI of 29.1 Ashley [name] is currently in the action stage of change. As such, her goal is to continue with weight loss efforts. She has agreed to practicing portion control and making smarter food choices, such as increasing vegetables and decreasing simple  carbohydrates.   Exercise goals: All adults should avoid inactivity. Some physical activity is better than none, and adults who participate in any amount of physical activity gain some health benefits.  Behavioral modification strategies: increasing lean protein intake, meal planning and cooking strategies, keeping healthy foods in the home, and planning for success.  Ashley [name] has agreed to follow-up with our clinic in 4 weeks. She was informed of the importance of frequent follow-up visits to maximize her success with intensive lifestyle modifications for her multiple health conditions.   Objective:   Blood pressure 112/65, pulse 87, temperature 98.3 F (36.8 C), height 5\' 4"  (1.626 m), weight 169 lb (76.7 kg), last menstrual period 02/11/2022, SpO2 98 %. Body mass index is 29.01 kg/m.  General: Cooperative, alert, well developed, in no acute distress. HEENT: Conjunctivae and lids unremarkable. Cardiovascular: Regular rhythm.  Lungs: Normal work of breathing. Neurologic: No focal deficits.   Lab Results  Component Value Date   CREATININE 0.67 01/05/2022   BUN 13 01/05/2022   NA 138 01/05/2022   K 4.4 01/05/2022   CL 103 01/05/2022   CO2 20 01/05/2022   Lab Results  Component Value Date   ALT 15 01/05/2022   AST 20 01/05/2022   ALKPHOS 84 01/05/2022   BILITOT 0.4 01/05/2022   Lab Results  Component Value Date   HGBA1C 5.3 01/05/2022   HGBA1C 5.3 07/14/2021   HGBA1C 5.8 (H) 03/17/2015   Lab Results  Component Value Date   INSULIN 7.0 01/05/2022   INSULIN 4.8 07/14/2021  Lab Results  Component Value Date   TSH 1.918 03/17/2015   Lab Results  Component Value Date   CHOL 248 (H) 01/05/2022   HDL 84 01/05/2022   LDLCALC 152 (H) 01/05/2022   TRIG 73 01/05/2022   Lab Results  Component Value Date   VD25OH 47.8 01/05/2022   VD25OH 35.9 07/14/2021   Lab Results  Component Value Date   WBC 13.2 (H) 11/08/2016   HGB 12.3 11/08/2016   HCT 37.3 11/08/2016   MCV  83.4 11/08/2016   PLT 281 11/08/2016   No results found for: "IRON", "TIBC", "FERRITIN"  Attestation Statements:   Reviewed by clinician on day of visit: allergies, medications, problem list, medical history, surgical history, family history, social history, and previous encounter notes.  I, Fortino Sic, RMA am acting as transcriptionist for Reuben Likes, MD.  I have reviewed the above documentation for accuracy and completeness, and I agree with the above. - Reuben Likes, MD

## 2022-03-15 ENCOUNTER — Encounter (INDEPENDENT_AMBULATORY_CARE_PROVIDER_SITE_OTHER): Payer: Self-pay

## 2022-04-03 ENCOUNTER — Ambulatory Visit (INDEPENDENT_AMBULATORY_CARE_PROVIDER_SITE_OTHER): Payer: 59 | Admitting: Family Medicine

## 2022-04-03 ENCOUNTER — Encounter (INDEPENDENT_AMBULATORY_CARE_PROVIDER_SITE_OTHER): Payer: Self-pay | Admitting: Family Medicine

## 2022-04-03 VITALS — BP 99/65 | HR 90 | Temp 98.5°F | Ht 64.0 in | Wt 167.0 lb

## 2022-04-03 DIAGNOSIS — Z6828 Body mass index (BMI) 28.0-28.9, adult: Secondary | ICD-10-CM | POA: Diagnosis not present

## 2022-04-03 DIAGNOSIS — E7849 Other hyperlipidemia: Secondary | ICD-10-CM | POA: Diagnosis not present

## 2022-04-03 DIAGNOSIS — E669 Obesity, unspecified: Secondary | ICD-10-CM

## 2022-04-03 DIAGNOSIS — R632 Polyphagia: Secondary | ICD-10-CM | POA: Diagnosis not present

## 2022-04-03 MED ORDER — TOPIRAMATE 50 MG PO TABS: 75.0000 mg | ORAL_TABLET | Freq: Every day | ORAL | 0 refills | Status: DC

## 2022-04-03 MED ORDER — LOMAIRA 8 MG PO TABS: 12.0000 mg | ORAL_TABLET | Freq: Every day | ORAL | 0 refills | Status: DC

## 2022-04-08 NOTE — Progress Notes (Signed)
Chief Complaint:   OBESITY Ashley [name] is here to discuss her progress with her obesity treatment plan along with follow-up of her obesity related diagnoses. Ashley [name] is on practicing portion control and making smarter food choices, such as increasing vegetables and decreasing simple carbohydrates and states she is following her eating plan approximately 75% of the time. Ashley [name] states she is skating 15 to 30 minutes 3 to 4 times per week.  Today's visit was #: 15 Starting weight: 176 lbs Starting date: 04/13/2021 Today's weight: 167 lbs Today's date: 04/03/2022 Total lbs lost to date: 9 Total lbs lost since last in-office visit: 2  Interim History: Ashley [name] had a colonoscopy last week and had to be off the combination of phentermine and topiramate for a week. She realizes that she had an increase in food consumption and hunger. Ashley [name]'s only upcoming events are friends and family day. She hasn't been journaling and has just been trying to be mindful.  Subjective:   1. Polyphagia Ashley [name] is on a combination of phentermine and topiramate. Since starting the treatment dose, she has lost 9 pounds. No side effects are noted with no concerns. PDMP was checked.  2. Other hyperlipidemia Ashley [name] is not on medication. Her LDL was elevated on blood work in early June.  Assessment/Plan:   1. Polyphagia Ashley [name] agrees to continue taking Lomaira and topiramate and will follow up as directed.  - topiramate (TOPAMAX) 50 MG tablet; Take 1.5 tablets (75 mg total) by mouth daily.  Dispense: 45 tablet; Refill: 0 - Phentermine HCl (LOMAIRA) 8 MG TABS; Take 12 mg by mouth daily.  Dispense: 45 tablet; Refill: 0  2. Other hyperlipidemia Repeat labs will be done with her PCP at her yearly physical.  3. Obesity with current BMI of 28.7 Ashley [name] is currently in the action stage of change. As such, her goal is to continue with weight loss efforts. She has agreed to keeping a food journal and adhering to  recommended goals of 1100 to 1200 calories and 80+ grams of protein daily. Ashley [name] agrees to really recommit to journaling.  Exercise goals: All adults should avoid inactivity. Some physical activity is better than none, and adults who participate in any amount of physical activity gain some health benefits.  Behavioral modification strategies: increasing lean protein intake, meal planning and cooking strategies, keeping healthy foods in the home, and keeping a strict food journal.  Ashley [name] has agreed to follow-up with our clinic in 3 to 4 weeks. She was informed of the importance of frequent follow-up visits to maximize her success with intensive lifestyle modifications for her multiple health conditions.   Objective:   Blood pressure 99/65, pulse 90, temperature 98.5 F (36.9 C), height 5\' 4"  (1.626 m), weight 167 lb (75.8 kg), last menstrual period 02/11/2022, SpO2 100 %. Body mass index is 28.67 kg/m.  General: Cooperative, alert, well developed, in no acute distress. HEENT: Conjunctivae and lids unremarkable. Cardiovascular: Regular rhythm.  Lungs: Normal work of breathing. Neurologic: No focal deficits.   Lab Results  Component Value Date   CREATININE 0.67 01/05/2022   BUN 13 01/05/2022   NA 138 01/05/2022   K 4.4 01/05/2022   CL 103 01/05/2022   CO2 20 01/05/2022   Lab Results  Component Value Date   ALT 15 01/05/2022   AST 20 01/05/2022   ALKPHOS 84 01/05/2022   BILITOT 0.4 01/05/2022   Lab Results  Component Value Date   HGBA1C 5.3 01/05/2022   HGBA1C 5.3 07/14/2021  HGBA1C 5.8 (H) 03/17/2015   Lab Results  Component Value Date   INSULIN 7.0 01/05/2022   INSULIN 4.8 07/14/2021   Lab Results  Component Value Date   TSH 1.918 03/17/2015   Lab Results  Component Value Date   CHOL 248 (H) 01/05/2022   HDL 84 01/05/2022   LDLCALC 152 (H) 01/05/2022   TRIG 73 01/05/2022   Lab Results  Component Value Date   VD25OH 47.8 01/05/2022   VD25OH 35.9  07/14/2021   Lab Results  Component Value Date   WBC 13.2 (H) 11/08/2016   HGB 12.3 11/08/2016   HCT 37.3 11/08/2016   MCV 83.4 11/08/2016   PLT 281 11/08/2016   No results found for: "IRON", "TIBC", "FERRITIN"  Attestation Statements:   Reviewed by clinician on day of visit: allergies, medications, problem list, medical history, surgical history, family history, social history, and previous encounter notes.  IKirke Corin, CMA, am acting as transcriptionist for Reuben Likes, MD  I have reviewed the above documentation for accuracy and completeness, and I agree with the above. - Reuben Likes, MD

## 2022-05-08 ENCOUNTER — Ambulatory Visit (INDEPENDENT_AMBULATORY_CARE_PROVIDER_SITE_OTHER): Payer: 59 | Admitting: Family Medicine

## 2022-05-08 ENCOUNTER — Encounter (INDEPENDENT_AMBULATORY_CARE_PROVIDER_SITE_OTHER): Payer: Self-pay | Admitting: Family Medicine

## 2022-05-08 VITALS — BP 103/68 | HR 83 | Temp 98.0°F | Ht 64.0 in | Wt 164.0 lb

## 2022-05-08 DIAGNOSIS — E7849 Other hyperlipidemia: Secondary | ICD-10-CM

## 2022-05-08 DIAGNOSIS — R632 Polyphagia: Secondary | ICD-10-CM

## 2022-05-08 DIAGNOSIS — Z6828 Body mass index (BMI) 28.0-28.9, adult: Secondary | ICD-10-CM

## 2022-05-08 DIAGNOSIS — E669 Obesity, unspecified: Secondary | ICD-10-CM

## 2022-05-08 MED ORDER — LOMAIRA 8 MG PO TABS
12.0000 mg | ORAL_TABLET | Freq: Every day | ORAL | 0 refills | Status: DC
Start: 2022-05-08 — End: 2022-06-14

## 2022-05-08 MED ORDER — TOPIRAMATE 50 MG PO TABS: 75.0000 mg | ORAL_TABLET | Freq: Every day | ORAL | 0 refills | Status: DC

## 2022-05-09 NOTE — Progress Notes (Signed)
Chief Complaint:   OBESITY Ashley [name] is here to discuss her progress with her obesity treatment plan along with follow-up of her obesity related diagnoses. Ashley [name] is on keeping a food journal and adhering to recommended goals of 1100-1200 calories and 80+ grams of protein and states she is following her eating plan approximately 100% of the time. Ashley [name] states she is walking her dog 10 minutes 6 times per week.  Today's visit was #: 16 Starting weight: 176 lbs Starting date: 04/13/2021 Today's weight: 164 lbs Today's date: 05/08/2022 Total lbs lost to date: 12 lbs Total lbs lost since last in-office visit: 3  Interim History: Ashley [name] is going to [facility] next week and she is very excited. She has been trying to mindful of food choices but has not started logging. Has not brought snacks back into the house. Thinks she could set a reminder to start logging.  Subjective:   1. Polyphagia Ashley [name] is on combo, Phentermine and Topamax. She lost 9 lbs on the 1st treatment dose. Starting weight at 175 lbs. PDMP ( prescription drug monitoring program) checked-- no concerns noted.   2. Other hyperlipidemia Ashley [name]'s last LDL of 152, HDL of 84 and Trigly 73, not on a statin.  Assessment/Plan:   1. Polyphagia We will refill Lomaira 12 mg by mouth daily AND Topamax 75 mg by mouth daily for 1 month with 0 refills.  -Refill Phentermine HCl (LOMAIRA) 8 MG TABS; Take 12 mg by mouth daily.  Dispense: 45 tablet; Refill: 0  -Refill topiramate (TOPAMAX) 50 MG tablet; Take 1.5 tablets (75 mg total) by mouth daily.  Dispense: 45 tablet; Refill: 0  2. Other hyperlipidemia Follow up on labs from Dr. Paulino Rily.  3. Obesity with current BMI of 28.2 Ashley [name] is currently in the action stage of change. As such, her goal is to continue with weight loss efforts. She has agreed to keeping a food journal and adhering to recommended goals of 1100-1200 calories and 80+ grams of protein daily.   Exercise  goals: All adults should avoid inactivity. Some physical activity is better than none, and adults who participate in any amount of physical activity gain some health benefits.  Behavioral modification strategies: increasing lean protein intake, meal planning and cooking strategies, keeping healthy foods in the home, planning for success, and keeping a strict food journal.  Ashley [name] has agreed to follow-up with our clinic in 4 weeks. She was informed of the importance of frequent follow-up visits to maximize her success with intensive lifestyle modifications for her multiple health conditions.   Objective:   Blood pressure 103/68, pulse 83, temperature 98 F (36.7 C), height 5\' 4"  (1.626 m), weight 164 lb (74.4 kg), last menstrual period 04/09/2022, SpO2 100 %. Body mass index is 28.15 kg/m.  General: Cooperative, alert, well developed, in no acute distress. HEENT: Conjunctivae and lids unremarkable. Cardiovascular: Regular rhythm.  Lungs: Normal work of breathing. Neurologic: No focal deficits.   Lab Results  Component Value Date   CREATININE 0.67 01/05/2022   BUN 13 01/05/2022   NA 138 01/05/2022   K 4.4 01/05/2022   CL 103 01/05/2022   CO2 20 01/05/2022   Lab Results  Component Value Date   ALT 15 01/05/2022   AST 20 01/05/2022   ALKPHOS 84 01/05/2022   BILITOT 0.4 01/05/2022   Lab Results  Component Value Date   HGBA1C 5.3 01/05/2022   HGBA1C 5.3 07/14/2021   HGBA1C 5.8 (H) 03/17/2015   Lab Results  Component Value  Date   INSULIN 7.0 01/05/2022   INSULIN 4.8 07/14/2021   Lab Results  Component Value Date   TSH 1.918 03/17/2015   Lab Results  Component Value Date   CHOL 248 (H) 01/05/2022   HDL 84 01/05/2022   LDLCALC 152 (H) 01/05/2022   TRIG 73 01/05/2022   Lab Results  Component Value Date   VD25OH 47.8 01/05/2022   VD25OH 35.9 07/14/2021   Lab Results  Component Value Date   WBC 13.2 (H) 11/08/2016   HGB 12.3 11/08/2016   HCT 37.3 11/08/2016    MCV 83.4 11/08/2016   PLT 281 11/08/2016   No results found for: "IRON", "TIBC", "FERRITIN"  Attestation Statements:   Reviewed by clinician on day of visit: allergies, medications, problem list, medical history, surgical history, family history, social history, and previous encounter notes.  I, Elnora Morrison, RMA am acting as transcriptionist for Coralie Common, MD.  I have reviewed the above documentation for accuracy and completeness, and I agree with the above. - Coralie Common, MD

## 2022-05-26 ENCOUNTER — Encounter (HOSPITAL_COMMUNITY): Payer: Self-pay | Admitting: *Deleted

## 2022-06-14 ENCOUNTER — Encounter (INDEPENDENT_AMBULATORY_CARE_PROVIDER_SITE_OTHER): Payer: Self-pay | Admitting: Family Medicine

## 2022-06-14 ENCOUNTER — Ambulatory Visit (INDEPENDENT_AMBULATORY_CARE_PROVIDER_SITE_OTHER): Payer: 59 | Admitting: Family Medicine

## 2022-06-14 VITALS — BP 106/71 | HR 94 | Temp 98.5°F | Ht 64.0 in | Wt 165.0 lb

## 2022-06-14 DIAGNOSIS — Z6828 Body mass index (BMI) 28.0-28.9, adult: Secondary | ICD-10-CM

## 2022-06-14 DIAGNOSIS — R632 Polyphagia: Secondary | ICD-10-CM | POA: Diagnosis not present

## 2022-06-14 DIAGNOSIS — E669 Obesity, unspecified: Secondary | ICD-10-CM

## 2022-06-14 DIAGNOSIS — E7849 Other hyperlipidemia: Secondary | ICD-10-CM

## 2022-06-14 DIAGNOSIS — Z6830 Body mass index (BMI) 30.0-30.9, adult: Secondary | ICD-10-CM

## 2022-06-14 MED ORDER — LOMAIRA 8 MG PO TABS: 12.0000 mg | ORAL_TABLET | Freq: Every day | ORAL | 0 refills | Status: DC

## 2022-06-14 MED ORDER — TOPIRAMATE 50 MG PO TABS: 75.0000 mg | ORAL_TABLET | Freq: Every day | ORAL | 0 refills | Status: DC

## 2022-06-20 NOTE — Progress Notes (Signed)
Chief Complaint:   OBESITY Ashley [name] is here to discuss her progress with her obesity treatment plan along with follow-up of her obesity related diagnoses. Ashley [name] is on keeping a food journal and adhering to recommended goals of 1100-1200 calories and 80+ grams of protein and states she is following her eating plan approximately 85% of the time. Ashley [name] states she is walking dog 10 minutes 6 times per week.  Today's visit was #: 17 Starting weight: 176 lbs Starting date: 04/13/2021 Today's weight: 165 lbs Today's date: 06/14/2022 Total lbs lost to date: 11 lbs Total lbs lost since last in-office visit: 0  Interim History: Ashley [name] went to [employer] this past week. She has been journaling consistently. Occasionally went up to  1300/1400 calories daily, especially around her menstrual cycle. Working on water fluid intake. Has cut back on fried foods. Averaging 50 grams of protein daily.  Subjective:   1. Polyphagia Ashley [name] is on Lomaria and Topamax. Her starting weight of 175 and hit 9 lb weight loss goal by 3 month mark. PDMP ( prescription drug monitoring program) checked.  2. Other hyperlipidemia Ashley [name]'s last LDL 152, HDL of 84 and Trigly of 73. She is not on medications.  Assessment/Plan:   1. Polyphagia We will refill Topamax 75 mg by mouth daily AND refill Phentermine 12 mg by mouth daily for 1 month with 0 refills.  -Refill topiramate (TOPAMAX) 50 MG tablet; Take 1.5 tablets (75 mg total) by mouth daily.  Dispense: 45 tablet; Refill: 0  -Refill Phentermine HCl (LOMAIRA) 8 MG TABS; Take 12 mg by mouth daily.  Dispense: 45 tablet; Refill: 0  2. Other hyperlipidemia Labs per PCP or in Jan 2024--follow up labs Sept 2023.  3. Obesity with current BMI of 28.5 Ashley [name] is currently in the action stage of change. As such, her goal is to continue with weight loss efforts. She has agreed to keeping a food journal and adhering to recommended goals of 1100-1200 calories and  80+ grams of protein daily.   Exercise goals: All adults should avoid inactivity. Some physical activity is better than none, and adults who participate in any amount of physical activity gain some health benefits.  Behavioral modification strategies: increasing lean protein intake, meal planning and cooking strategies, keeping healthy foods in the home, and keeping a strict food journal.  Ashley [name] has agreed to follow-up with our clinic in 4 weeks. She was informed of the importance of frequent follow-up visits to maximize her success with intensive lifestyle modifications for her multiple health conditions.   Objective:   Blood pressure 106/71, pulse 94, temperature 98.5 F (36.9 C), height 5\' 4"  (1.626 m), weight 165 lb (74.8 kg), SpO2 100 %. Body mass index is 28.32 kg/m.  General: Cooperative, alert, well developed, in no acute distress. HEENT: Conjunctivae and lids unremarkable. Cardiovascular: Regular rhythm.  Lungs: Normal work of breathing. Neurologic: No focal deficits.   Lab Results  Component Value Date   CREATININE 0.67 01/05/2022   BUN 13 01/05/2022   NA 138 01/05/2022   K 4.4 01/05/2022   CL 103 01/05/2022   CO2 20 01/05/2022   Lab Results  Component Value Date   ALT 15 01/05/2022   AST 20 01/05/2022   ALKPHOS 84 01/05/2022   BILITOT 0.4 01/05/2022   Lab Results  Component Value Date   HGBA1C 5.3 01/05/2022   HGBA1C 5.3 07/14/2021   HGBA1C 5.8 (H) 03/17/2015   Lab Results  Component Value Date   INSULIN  7.0 01/05/2022   INSULIN 4.8 07/14/2021   Lab Results  Component Value Date   TSH 1.918 03/17/2015   Lab Results  Component Value Date   CHOL 248 (H) 01/05/2022   HDL 84 01/05/2022   LDLCALC 152 (H) 01/05/2022   TRIG 73 01/05/2022   Lab Results  Component Value Date   VD25OH 47.8 01/05/2022   VD25OH 35.9 07/14/2021   Lab Results  Component Value Date   WBC 13.2 (H) 11/08/2016   HGB 12.3 11/08/2016   HCT 37.3 11/08/2016   MCV 83.4  11/08/2016   PLT 281 11/08/2016   No results found for: "IRON", "TIBC", "FERRITIN"  Attestation Statements:   Reviewed by clinician on day of visit: allergies, medications, problem list, medical history, surgical history, family history, social history, and previous encounter notes.  I, Fortino Sic, RMA am acting as transcriptionist for Reuben Likes, MD.  I have reviewed the above documentation for accuracy and completeness, and I agree with the above. - Reuben Likes, MD

## 2022-07-14 ENCOUNTER — Other Ambulatory Visit (INDEPENDENT_AMBULATORY_CARE_PROVIDER_SITE_OTHER): Payer: Self-pay | Admitting: Family Medicine

## 2022-07-14 DIAGNOSIS — R632 Polyphagia: Secondary | ICD-10-CM

## 2022-07-18 ENCOUNTER — Ambulatory Visit (INDEPENDENT_AMBULATORY_CARE_PROVIDER_SITE_OTHER): Payer: 59 | Admitting: Family Medicine

## 2022-07-18 VITALS — BP 103/69 | HR 83 | Temp 98.3°F | Ht 64.0 in | Wt 165.0 lb

## 2022-07-18 DIAGNOSIS — R632 Polyphagia: Secondary | ICD-10-CM

## 2022-07-18 DIAGNOSIS — E669 Obesity, unspecified: Secondary | ICD-10-CM | POA: Diagnosis not present

## 2022-07-18 DIAGNOSIS — Z6828 Body mass index (BMI) 28.0-28.9, adult: Secondary | ICD-10-CM | POA: Diagnosis not present

## 2022-07-18 MED ORDER — TOPIRAMATE 50 MG PO TABS: 75.0000 mg | ORAL_TABLET | Freq: Every day | ORAL | 0 refills | Status: DC

## 2022-07-18 MED ORDER — LOMAIRA 8 MG PO TABS: 12.0000 mg | ORAL_TABLET | Freq: Every day | ORAL | 0 refills | Status: DC

## 2022-07-19 ENCOUNTER — Ambulatory Visit (INDEPENDENT_AMBULATORY_CARE_PROVIDER_SITE_OTHER): Payer: 59 | Admitting: Family Medicine

## 2022-07-27 NOTE — Progress Notes (Signed)
Chief Complaint:   OBESITY Ashley [name] is here to discuss her progress with her obesity treatment plan along with follow-up of her obesity related diagnoses. Ashley [name] is on keeping a food journal and adhering to recommended goals of 1000-1200 calories and 90+ protein and states she is following her eating plan approximately 100% of the time. Ashley [name] states she is walking her dog 10 minutes 7  times per week.  Today's visit was #: 18 Starting weight: 176 lbs Starting date: 04/13/2021 Today's weight: 165 lbs Today's date: 07/18/2022 Total lbs lost to date: 11 lbs Total lbs lost since last in-office visit: 0  Interim History: First office visit with me, last office visit with Dr Lawson Radar on 06/14/2022.  S/P bariatric surgery, had sleeve in 2018.  Today she is not really following meal plan at all.    Subjective:   1. Polyphagia Patient did not follow meal plan per se, and is not sure of caloric and protein intake.  Assessment/Plan:  No orders of the defined types were placed in this encounter.   Medications Discontinued During This Encounter  Medication Reason   topiramate (TOPAMAX) 50 MG tablet Reorder   Phentermine HCl (LOMAIRA) 8 MG TABS Reorder     Meds ordered this encounter  Medications   Phentermine HCl (LOMAIRA) 8 MG TABS    Sig: Take 12 mg by mouth daily.    Dispense:  45 tablet    Refill:  0   topiramate (TOPAMAX) 50 MG tablet    Sig: Take 1.5 tablets (75 mg total) by mouth daily.    Dispense:  45 tablet    Refill:  0     1. Polyphagia Told patient she must lose each time she can or else we will discontinue the medications.  Refill- Phentermine HCl (LOMAIRA) 8 MG TABS; Take 12 mg by mouth daily.  Dispense: 45 tablet; Refill: 0  Refill- topiramate (TOPAMAX) 50 MG tablet; Take 1.5 tablets (75 mg total) by mouth daily.  Dispense: 45 tablet; Refill: 0  2. Obesity with current BMI of 28.4 Patient will journal all intake and log it on separate sheet that was given  to her.   Ashley [name] is currently in the action stage of change. As such, her goal is to continue with weight loss efforts. She has agreed to keeping a food journal and adhering to recommended goals of 1100-1200 calories and 90+ protein.   Exercise goals:  As is.   Behavioral modification strategies: holiday eating strategies  and avoiding temptations.  Ashley [name] has agreed to follow-up with our clinic in 4 weeks. She was informed of the importance of frequent follow-up visits to maximize her success with intensive lifestyle modifications for her multiple health conditions.   Objective:   Blood pressure 103/69, pulse 83, temperature 98.3 F (36.8 C), height 5\' 4"  (1.626 m), weight 165 lb (74.8 kg), SpO2 98 %. Body mass index is 28.32 kg/m.  General: Cooperative, alert, well developed, in no acute distress. HEENT: Conjunctivae and lids unremarkable. Cardiovascular: Regular rhythm.  Lungs: Normal work of breathing. Neurologic: No focal deficits.   Lab Results  Component Value Date   CREATININE 0.67 01/05/2022   BUN 13 01/05/2022   NA 138 01/05/2022   K 4.4 01/05/2022   CL 103 01/05/2022   CO2 20 01/05/2022   Lab Results  Component Value Date   ALT 15 01/05/2022   AST 20 01/05/2022   ALKPHOS 84 01/05/2022   BILITOT 0.4 01/05/2022   Lab Results  Component Value Date   HGBA1C 5.3 01/05/2022   HGBA1C 5.3 07/14/2021   HGBA1C 5.8 (H) 03/17/2015   Lab Results  Component Value Date   INSULIN 7.0 01/05/2022   INSULIN 4.8 07/14/2021   Lab Results  Component Value Date   TSH 1.918 03/17/2015   Lab Results  Component Value Date   CHOL 248 (H) 01/05/2022   HDL 84 01/05/2022   LDLCALC 152 (H) 01/05/2022   TRIG 73 01/05/2022   Lab Results  Component Value Date   VD25OH 47.8 01/05/2022   VD25OH 35.9 07/14/2021   Lab Results  Component Value Date   WBC 13.2 (H) 11/08/2016   HGB 12.3 11/08/2016   HCT 37.3 11/08/2016   MCV 83.4 11/08/2016   PLT 281 11/08/2016   No  results found for: "IRON", "TIBC", "FERRITIN"  Attestation Statements:   Reviewed by clinician on day of visit: allergies, medications, problem list, medical history, surgical history, family history, social history, and previous encounter notes.  I, Davy Pique, RMA, am acting as Location manager for [employer], DO.   I have reviewed the above documentation for accuracy and completeness, and I agree with the above. Marjory Sneddon, D.O.  The [place] was signed into law in 2016 which includes the topic of electronic health records.  This provides immediate access to information in MyChart.  This includes consultation notes, operative notes, office notes, lab results and pathology reports.  If you have any questions about what you read please let us know at your next visit so we can discuss your concerns and take corrective action if need be.  We are right here with you.

## 2022-08-21 ENCOUNTER — Ambulatory Visit (INDEPENDENT_AMBULATORY_CARE_PROVIDER_SITE_OTHER): Payer: 59 | Admitting: Family Medicine

## 2022-08-23 ENCOUNTER — Encounter (INDEPENDENT_AMBULATORY_CARE_PROVIDER_SITE_OTHER): Payer: Self-pay | Admitting: Family Medicine

## 2022-08-23 ENCOUNTER — Ambulatory Visit (INDEPENDENT_AMBULATORY_CARE_PROVIDER_SITE_OTHER): Payer: 59 | Admitting: Family Medicine

## 2022-08-23 VITALS — BP 111/71 | HR 76 | Temp 98.3°F | Ht 64.0 in | Wt 164.0 lb

## 2022-08-23 DIAGNOSIS — Z6828 Body mass index (BMI) 28.0-28.9, adult: Secondary | ICD-10-CM

## 2022-08-23 DIAGNOSIS — E669 Obesity, unspecified: Secondary | ICD-10-CM | POA: Diagnosis not present

## 2022-08-23 DIAGNOSIS — R632 Polyphagia: Secondary | ICD-10-CM

## 2022-08-23 DIAGNOSIS — E7849 Other hyperlipidemia: Secondary | ICD-10-CM

## 2022-08-23 MED ORDER — TOPIRAMATE 50 MG PO TABS: 75.0000 mg | ORAL_TABLET | Freq: Every day | ORAL | 0 refills | Status: DC

## 2022-08-23 MED ORDER — LOMAIRA 8 MG PO TABS: 12.0000 mg | ORAL_TABLET | Freq: Every day | ORAL | 0 refills | Status: DC

## 2022-09-05 NOTE — Progress Notes (Signed)
Chief Complaint:   OBESITY Ashley [name] is here to discuss her progress with her obesity treatment plan along with follow-up of her obesity related diagnoses. Ashley [name] is on keeping a food journal and adhering to recommended goals of 1100-1200 calories and 90+ grams of protein and states she is following her eating plan approximately 40-45% of the time. Ashley [name] states she is walking 30 minutes 6-7 times per week.  Today's visit was #: 45 Starting weight: 176 lbs Starting date: 04/13/2021 Today's weight: 164 lbs Today's date: 08/23/2022 Total lbs lost to date: 12 lbs Total lbs lost since last in-office visit: 1  Interim History: Ashley [name] had some issues with her prescription since last appointment.  Started a keto diet 3 days ago and wants to focus on getting protein in.  Has her son birthday coming up but otherwise not much going on.  She has questions about how much carbs she can take in daily.  Subjective:   1. Polyphagia Ashley [name] is on combo of Lomaira/Topiramate.  No side effects noted on either medication.  PDMP checked; down 10 pounds since starting medications.  2. Other hyperlipidemia LDL elevated on last lab.  No recent labs at next month.  Assessment/Plan:   1. Polyphagia We will refill Phentermine 12 mg by mouth daily and refill Topamax 75 mg by mouth daily for 1 month with 0 refills.  -Refill Phentermine HCl (LOMAIRA) 8 MG TABS; Take 12 mg by mouth daily.  Dispense: 45 tablet; Refill: 0  -Refill topiramate (TOPAMAX) 50 MG tablet; Take 1.5 tablets (75 mg total) by mouth daily.  Dispense: 45 tablet; Refill: 0  2. Other hyperlipidemia Will follow-up on labs at next appointment.  3. Obesity with current BMI of 28.2 Ashley [name] is currently in the action stage of change. As such, her goal is to continue with weight loss efforts. She has agreed to following a lower carbohydrate, vegetable and lean protein rich diet plan.   Exercise goals: As is.  Behavioral modification  strategies: increasing lean protein intake, meal planning and cooking strategies, keeping healthy foods in the home, planning for success, and keeping a strict food journal.  Ashley [name] has agreed to follow-up with our clinic in 4 weeks. She was informed of the importance of frequent follow-up visits to maximize her success with intensive lifestyle modifications for her multiple health conditions.   Objective:   Blood pressure 111/71, pulse 76, temperature 98.3 F (36.8 C), height 5\' 4"  (1.626 m), weight 164 lb (74.4 kg), SpO2 98 %. Body mass index is 28.15 kg/m.  General: Cooperative, alert, well developed, in no acute distress. HEENT: Conjunctivae and lids unremarkable. Cardiovascular: Regular rhythm.  Lungs: Normal work of breathing. Neurologic: No focal deficits.   Lab Results  Component Value Date   CREATININE 0.67 01/05/2022   BUN 13 01/05/2022   NA 138 01/05/2022   K 4.4 01/05/2022   CL 103 01/05/2022   CO2 20 01/05/2022   Lab Results  Component Value Date   ALT 15 01/05/2022   AST 20 01/05/2022   ALKPHOS 84 01/05/2022   BILITOT 0.4 01/05/2022   Lab Results  Component Value Date   HGBA1C 5.3 01/05/2022   HGBA1C 5.3 07/14/2021   HGBA1C 5.8 (H) 03/17/2015   Lab Results  Component Value Date   INSULIN 7.0 01/05/2022   INSULIN 4.8 07/14/2021   Lab Results  Component Value Date   TSH 1.918 03/17/2015   Lab Results  Component Value Date   CHOL 248 (H) 01/05/2022  HDL 84 01/05/2022   LDLCALC 152 (H) 01/05/2022   TRIG 73 01/05/2022   Lab Results  Component Value Date   VD25OH 47.8 01/05/2022   VD25OH 35.9 07/14/2021   Lab Results  Component Value Date   WBC 13.2 (H) 11/08/2016   HGB 12.3 11/08/2016   HCT 37.3 11/08/2016   MCV 83.4 11/08/2016   PLT 281 11/08/2016   No results found for: "IRON", "TIBC", "FERRITIN"  Attestation Statements:   Reviewed by clinician on day of visit: allergies, medications, problem list, medical history, surgical history,  family history, social history, and previous encounter notes.  I, Elnora Morrison, RMA am acting as transcriptionist for Coralie Common, MD.  I have reviewed the above documentation for accuracy and completeness, and I agree with the above. - Coralie Common, MD

## 2022-09-19 ENCOUNTER — Ambulatory Visit (INDEPENDENT_AMBULATORY_CARE_PROVIDER_SITE_OTHER): Payer: 59 | Admitting: Family Medicine

## 2022-09-19 ENCOUNTER — Encounter (INDEPENDENT_AMBULATORY_CARE_PROVIDER_SITE_OTHER): Payer: Self-pay | Admitting: Family Medicine

## 2022-09-19 VITALS — BP 104/68 | HR 87 | Temp 98.2°F | Ht 64.0 in | Wt 163.0 lb

## 2022-09-19 DIAGNOSIS — E559 Vitamin D deficiency, unspecified: Secondary | ICD-10-CM | POA: Diagnosis not present

## 2022-09-19 DIAGNOSIS — R7303 Prediabetes: Secondary | ICD-10-CM | POA: Diagnosis not present

## 2022-09-19 DIAGNOSIS — R632 Polyphagia: Secondary | ICD-10-CM | POA: Diagnosis not present

## 2022-09-19 DIAGNOSIS — E669 Obesity, unspecified: Secondary | ICD-10-CM

## 2022-09-19 DIAGNOSIS — E7849 Other hyperlipidemia: Secondary | ICD-10-CM | POA: Diagnosis not present

## 2022-09-19 DIAGNOSIS — Z6830 Body mass index (BMI) 30.0-30.9, adult: Secondary | ICD-10-CM

## 2022-09-19 DIAGNOSIS — Z6827 Body mass index (BMI) 27.0-27.9, adult: Secondary | ICD-10-CM

## 2022-09-19 MED ORDER — LOMAIRA 8 MG PO TABS: 12.0000 mg | ORAL_TABLET | Freq: Every day | ORAL | 0 refills | Status: DC

## 2022-09-19 MED ORDER — TOPIRAMATE 50 MG PO TABS: 75.0000 mg | ORAL_TABLET | Freq: Every day | ORAL | 0 refills | Status: DC

## 2022-09-19 NOTE — Progress Notes (Deleted)
Since last appointment she has tried to stay focused on keto for 2 weeks.  Feels tired of cold and thinks in the next 5 years she will move to somewhere warmer.  She has been mostly working.  Recognizes she needs to incorporate more protein.  She bought more deli meat and cheese to get in more protein.  Brought protein shakes as well.  Needs to cook more and plans to cook today.  Needs to preplan meals for the week and meal prep.  Wants to start that this week.

## 2022-09-20 LAB — COMPREHENSIVE METABOLIC PANEL
ALT: 12 IU/L (ref 0–32)
AST: 18 IU/L (ref 0–40)
Albumin/Globulin Ratio: 1.8 (ref 1.2–2.2)
Albumin: 4.2 g/dL (ref 3.9–4.9)
Alkaline Phosphatase: 86 IU/L (ref 44–121)
BUN/Creatinine Ratio: 15 (ref 9–23)
BUN: 13 mg/dL (ref 6–24)
Bilirubin Total: 0.4 mg/dL (ref 0.0–1.2)
CO2: 19 mmol/L — ABNORMAL LOW (ref 20–29)
Calcium: 8.8 mg/dL (ref 8.7–10.2)
Chloride: 105 mmol/L (ref 96–106)
Creatinine, Ser: 0.87 mg/dL (ref 0.57–1.00)
Globulin, Total: 2.3 g/dL (ref 1.5–4.5)
Glucose: 71 mg/dL (ref 70–99)
Potassium: 3.9 mmol/L (ref 3.5–5.2)
Sodium: 138 mmol/L (ref 134–144)
Total Protein: 6.5 g/dL (ref 6.0–8.5)
eGFR: 84 mL/min/{1.73_m2} (ref >59)

## 2022-09-20 LAB — HEMOGLOBIN A1C
Est. average glucose Bld gHb Est-mCnc: 111 mg/dL
Hgb A1c MFr Bld: 5.5 % (ref 4.8–5.6)

## 2022-09-20 LAB — LIPID PANEL WITH LDL/HDL RATIO
Cholesterol, Total: 257 mg/dL — ABNORMAL HIGH (ref 100–199)
HDL: 93 mg/dL (ref >39)
LDL Chol Calc (NIH): 154 mg/dL — ABNORMAL HIGH (ref 0–99)
LDL/HDL Ratio: 1.7 ratio (ref 0.0–3.2)
Triglycerides: 64 mg/dL (ref 0–149)
VLDL Cholesterol Cal: 10 mg/dL (ref 5–40)

## 2022-09-20 LAB — VITAMIN D 25 HYDROXY (VIT D DEFICIENCY, FRACTURES): Vit D, 25-Hydroxy: 37.1 ng/mL (ref 30.0–100.0)

## 2022-09-20 LAB — INSULIN, RANDOM: INSULIN: 6.4 u[IU]/mL (ref 2.6–24.9)

## 2022-09-28 NOTE — Progress Notes (Signed)
Chief Complaint:   OBESITY Ashley [name] is here to discuss her progress with her obesity treatment plan along with follow-up of her obesity related diagnoses. Ashley [name] is on following a lower carbohydrate, vegetable and lean protein rich diet plan and states she is following her eating plan approximately 80% of the time. Ashley [name] states she is walking the dog 20 minutes 3-4 times per week.  Today's visit was #: 20 Starting weight: 176 lbs Starting date: 04/13/2021 Today's weight: 163 lbs Today's date: 09/19/2022 Total lbs lost to date: 13 lbs Total lbs lost since last in-office visit: 1  Interim History: Since last appointment Ashley [name] has tried to stay focused on keto for 2 weeks.  Feels tired of cold and thinks in the next 5 years she will move to somewhere warmer.  She has been mostly working.  Recognizes she needs to incorporate more protein.  She bought more deli meat and cheese to get in more protein.  Brought protein shakes as well.  Needs to cook more and plans to cook today.  Needs to preplan meals for the week and meal prep.  Wants to start that this week.  Subjective:   1. Prediabetes Ashley [name] is not on medication.  Last A1c was within normal limits but insulin somewhat elevated.  2. Polyphagia Taking combination topiramate/phentermine.  No side effects noted.  PDMP checked without any concerns.  Has lost 12 pounds since initial of medications.  3. Vitamin D deficiency On over-the-counter vitamin D 5K IU/daily.  Notes fatigue.  4. Other hyperlipidemia Not on medications.  Last LDL of 152, HDL of 84 and trig of 73.  Assessment/Plan:   1. Prediabetes We will obtain labs today.  - Hemoglobin A1c - Insulin, random - Comprehensive metabolic panel  2. Polyphagia Will Refill Phentermine 12 mg by mouth daily and Refill Topiramate 75 mg by mouth daily for 1 month with 0 refills.  -Refill topiramate (TOPAMAX) 50 MG tablet; Take 1.5 tablets (75 mg total) by mouth daily.   Dispense: 45 tablet; Refill: 0  -Refill Phentermine HCl (LOMAIRA) 8 MG TABS; Take 1.5 tablets (12 mg total) by mouth daily.  Dispense: 45 tablet; Refill: 0  3. Vitamin D deficiency We will obtain labs today.  - VITAMIN D 25 Hydroxy (Vit-D Deficiency, Fractures)  4. Other hyperlipidemia We will obtain labs today.  - Lipid Panel With LDL/HDL Ratio  5. Obesity with current BMI of 28.1 Ashley [name] is currently in the action stage of change. As such, her goal is to continue with weight loss efforts. She has agreed to following a lower carbohydrate, vegetable and lean protein rich diet plan.   Exercise goals: All adults should avoid inactivity. Some physical activity is better than none, and adults who participate in any amount of physical activity gain some health benefits.  Patient encouraged to add resistance training 10 minutes 3-4 times a week.  Behavioral modification strategies: increasing lean protein intake, meal planning and cooking strategies, keeping healthy foods in the home, and planning for success.  Ashley [name] has agreed to follow-up with our clinic in 2 weeks. She was informed of the importance of frequent follow-up visits to maximize her success with intensive lifestyle modifications for her multiple health conditions.   Ashley [name] was informed we would discuss her lab results at her next visit unless there is a critical issue that needs to be addressed sooner. Ashley [name] agreed to keep her next visit at the agreed upon time to discuss these results.  Objective:   Blood  pressure 104/68, pulse 87, temperature 98.2 F (36.8 C), height '5\' 4"'$  (1.626 m), weight 163 lb (73.9 kg), SpO2 100 %. Body mass index is 27.98 kg/m.  General: Cooperative, alert, well developed, in no acute distress. HEENT: Conjunctivae and lids unremarkable. Cardiovascular: Regular rhythm.  Lungs: Normal work of breathing. Neurologic: No focal deficits.   Lab Results  Component Value Date   CREATININE 0.87  09/19/2022   BUN 13 09/19/2022   NA 138 09/19/2022   K 3.9 09/19/2022   CL 105 09/19/2022   CO2 19 (L) 09/19/2022   Lab Results  Component Value Date   ALT 12 09/19/2022   AST 18 09/19/2022   ALKPHOS 86 09/19/2022   BILITOT 0.4 09/19/2022   Lab Results  Component Value Date   HGBA1C 5.5 09/19/2022   HGBA1C 5.3 01/05/2022   HGBA1C 5.3 07/14/2021   HGBA1C 5.8 (H) 03/17/2015   Lab Results  Component Value Date   INSULIN 6.4 09/19/2022   INSULIN 7.0 01/05/2022   INSULIN 4.8 07/14/2021   Lab Results  Component Value Date   TSH 1.918 03/17/2015   Lab Results  Component Value Date   CHOL 257 (H) 09/19/2022   HDL 93 09/19/2022   LDLCALC 154 (H) 09/19/2022   TRIG 64 09/19/2022   Lab Results  Component Value Date   VD25OH 37.1 09/19/2022   VD25OH 47.8 01/05/2022   VD25OH 35.9 07/14/2021   Lab Results  Component Value Date   WBC 13.2 (H) 11/08/2016   HGB 12.3 11/08/2016   HCT 37.3 11/08/2016   MCV 83.4 11/08/2016   PLT 281 11/08/2016   No results found for: "IRON", "TIBC", "FERRITIN"  Attestation Statements:   Reviewed by clinician on day of visit: allergies, medications, problem list, medical history, surgical history, family history, social history, and previous encounter notes.  I, Elnora Morrison, RMA am acting as transcriptionist for Coralie Common, MD.  I have reviewed the above documentation for accuracy and completeness, and I agree with the above. - Coralie Common, MD

## 2022-10-23 ENCOUNTER — Encounter (INDEPENDENT_AMBULATORY_CARE_PROVIDER_SITE_OTHER): Payer: Self-pay | Admitting: Family Medicine

## 2022-10-23 ENCOUNTER — Ambulatory Visit (INDEPENDENT_AMBULATORY_CARE_PROVIDER_SITE_OTHER): Payer: 59 | Admitting: Family Medicine

## 2022-10-23 VITALS — BP 97/67 | HR 79 | Temp 97.8°F | Ht 64.0 in | Wt 162.0 lb

## 2022-10-23 DIAGNOSIS — R632 Polyphagia: Secondary | ICD-10-CM | POA: Diagnosis not present

## 2022-10-23 DIAGNOSIS — E7849 Other hyperlipidemia: Secondary | ICD-10-CM | POA: Diagnosis not present

## 2022-10-23 DIAGNOSIS — E669 Obesity, unspecified: Secondary | ICD-10-CM

## 2022-10-23 DIAGNOSIS — Z6827 Body mass index (BMI) 27.0-27.9, adult: Secondary | ICD-10-CM

## 2022-10-23 DIAGNOSIS — E559 Vitamin D deficiency, unspecified: Secondary | ICD-10-CM | POA: Diagnosis not present

## 2022-10-23 MED ORDER — LOMAIRA 8 MG PO TABS: 12.0000 mg | ORAL_TABLET | Freq: Every day | ORAL | 0 refills | Status: DC

## 2022-10-23 MED ORDER — TOPIRAMATE 50 MG PO TABS: 75.0000 mg | ORAL_TABLET | Freq: Every day | ORAL | 0 refills | Status: DC

## 2022-10-23 NOTE — Progress Notes (Signed)
Chief Complaint:   OBESITY Ashley [name] is here to discuss her progress with her obesity treatment plan along with follow-up of her obesity related diagnoses. Ashley [name] is on following a lower carbohydrate, vegetable and lean protein rich diet plan and states she is following her eating plan approximately 85% of the time. Ashley [name] states she is not exercising.  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  She is trying to plan strategically to get peak hours.  She wants to start skating now that the weather has improved.  Her son and his dog are still living at her house. She recognizes she isn't drinking enough water.  She is working on her protein intake and being mindful of total carb intake.  No upcoming plans except for a few events scattered in the next few weeks.  Subjective:   1. Other hyperlipidemia Patient LDL 154, HDL 93, triglycerides 64.  Patient is not on any medication.  2. Vitamin D deficiency Patient is on OTC vitamin D 5000 IU daily.  Patient is positive for fatigue.  Decrease in vitamin D since last lab, patient is on OTC vitamin D.  3. Polyphagia Patient is on combination phentermine/topiramate.  Patient starting weight of 175 on 02/03/2022, total loss of 13 LBS.  PDMP checked, no concerns.  Assessment/Plan:   1. Other hyperlipidemia Follow-up labs in 4 months.  Patient encouraged and counseled on limiting saturated fat and increased resistance exercise.  2. Vitamin D deficiency Patient encouraged to take 10,000 IUs 3 times weekly and 5000 IU on other days.  3. Polyphagia Refill- Phentermine HCl (LOMAIRA) 8 MG TABS; Take 1.5 tablets (12 mg total) by mouth daily.  Dispense: 45 tablet; Refill: 0  Refill- topiramate (TOPAMAX) 50 MG  tablet; Take 1.5 tablets (75 mg total) by mouth daily.  Dispense: 45 tablet; Refill: 0  4. Obesity with current BMI of 27.9 Ashley [name] is currently in the action stage of change. As such, her goal is to continue with weight loss efforts. She has agreed to following a lower carbohydrate, vegetable and lean protein rich diet plan.   Exercise goals: All adults should avoid inactivity. Some physical activity is better than none, and adults who participate in any amount of physical activity gain some health benefits.  Behavioral modification strategies: increasing lean protein intake, meal planning and cooking strategies, keeping healthy foods in the home, and planning for success.  Ashley [name] has agreed to follow-up with our clinic in 4 weeks. She was informed of the importance of frequent follow-up visits to maximize her success with intensive lifestyle modifications for her multiple health conditions.   Objective:   Blood pressure 97/67, pulse 79, temperature 97.8 F (36.6 C), height 5\' 4"  (1.626 m), weight 162 lb (73.5 kg), SpO2 100 %. Body mass index is 27.81 kg/m.  General: Cooperative, alert, well developed, in no acute distress. HEENT: Conjunctivae and lids unremarkable. Cardiovascular: Regular rhythm.  Lungs: Normal work of breathing. Neurologic: No focal deficits.   Lab Results  Component Value Date   CREATININE 0.87 09/19/2022   BUN 13 09/19/2022   NA 138 09/19/2022   K 3.9 09/19/2022   CL 105 09/19/2022   CO2 19 (L) 09/19/2022   Lab Results  Component Value Date  ALT 12 09/19/2022   AST 18 09/19/2022   ALKPHOS 86 09/19/2022   BILITOT 0.4 09/19/2022   Lab Results  Component Value Date   HGBA1C 5.5 09/19/2022   HGBA1C 5.3 01/05/2022   HGBA1C 5.3 07/14/2021   HGBA1C 5.8 (H) 03/17/2015   Lab Results  Component Value Date   INSULIN 6.4 09/19/2022   INSULIN 7.0 01/05/2022   INSULIN 4.8 07/14/2021   Lab Results  Component Value Date   TSH 1.918 03/17/2015   Lab  Results  Component Value Date   CHOL 257 (H) 09/19/2022   HDL 93 09/19/2022   LDLCALC 154 (H) 09/19/2022   TRIG 64 09/19/2022   Lab Results  Component Value Date   VD25OH 37.1 09/19/2022   VD25OH 47.8 01/05/2022   VD25OH 35.9 07/14/2021   Lab Results  Component Value Date   WBC 13.2 (H) 11/08/2016   HGB 12.3 11/08/2016   HCT 37.3 11/08/2016   MCV 83.4 11/08/2016   PLT 281 11/08/2016   No results found for: "IRON", "TIBC", "FERRITIN"  Attestation Statements:   Reviewed by clinician on day of visit: allergies, medications, problem list, medical history, surgical history, family history, social history, and previous encounter notes.  I, Davy Pique, RMA, am acting as transcriptionist for Coralie Common, MD. I have reviewed the above documentation for accuracy and completeness, and I agree with the above. - Coralie Common, MD

## 2022-11-20 ENCOUNTER — Encounter (INDEPENDENT_AMBULATORY_CARE_PROVIDER_SITE_OTHER): Payer: Self-pay | Admitting: Family Medicine

## 2022-11-20 ENCOUNTER — Ambulatory Visit (INDEPENDENT_AMBULATORY_CARE_PROVIDER_SITE_OTHER): Payer: 59 | Admitting: Family Medicine

## 2022-11-20 VITALS — BP 102/70 | HR 91 | Temp 98.2°F | Ht 64.0 in | Wt 163.0 lb

## 2022-11-20 DIAGNOSIS — Z6828 Body mass index (BMI) 28.0-28.9, adult: Secondary | ICD-10-CM | POA: Diagnosis not present

## 2022-11-20 DIAGNOSIS — E669 Obesity, unspecified: Secondary | ICD-10-CM

## 2022-11-20 DIAGNOSIS — R632 Polyphagia: Secondary | ICD-10-CM

## 2022-11-20 DIAGNOSIS — E7849 Other hyperlipidemia: Secondary | ICD-10-CM

## 2022-11-20 MED ORDER — TOPIRAMATE 100 MG PO TABS: 100.0000 mg | ORAL_TABLET | Freq: Every day | ORAL | 0 refills | Status: DC

## 2022-11-20 MED ORDER — PHENTERMINE HCL 15 MG PO CAPS: 15.0000 mg | ORAL_CAPSULE | Freq: Every day | ORAL | 0 refills | Status: DC

## 2022-11-20 NOTE — Progress Notes (Signed)
Chief Complaint:   OBESITY Ashley [name] is here to discuss her progress with her obesity treatment plan along with follow-up of her obesity related diagnoses. Ashley [name] is on following a lower carbohydrate, vegetable and lean protein rich diet plan and states she is following her eating plan approximately 0% of the time. Ashley [name] states she is walking her dog for 10 minutes 7 times per week.  Today's visit was #: 22 Starting weight: 176 LBS Starting date: 04/13/2021 Today's weight: 163 lbs Today's date: 11/20/2022 Total lbs lost to date: 13 lbs Total lbs lost since last in-office visit: +1 lb  Interim History: Patient has had a lot going on since last appointment.  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  She just trying to make it to her vacation.  She has been just trying to monitor what food she is taking in and in what quantity.  She has also tried to work on water intake. She is going on vacation at end of next week and knows she can use that time to focus on her protein at that time. She is definitely more mindful but realizes she isn't getting all her protein in.   Subjective:   1. Polyphagia Patient is on combination Phentermine/Topiramate.  Patient has a starting weight of 175 lb.  PDMP checked, no concerns noted.    2. Other hyperlipidemia Last LDL 154, HDL 93, Triglycerides 64.  Patient is not on any medications.    Assessment/Plan:   1. Polyphagia Refill- phentermine 15 MG capsule; Take 1 capsule (15 mg total) by mouth daily.  Dispense: 30 capsule; Refill: 0  Refill- topiramate (TOPAMAX) 100 MG tablet; Take 1 tablet (100 mg total) by mouth daily.  Dispense: 30 tablet; Refill: 0  2. Other hyperlipidemia Repeat labs in July.  Continue low carb plan.   3. Obesity with current BMI of 28.0 Ashley [name] is currently in the action  stage of change. As such, her goal is to continue with weight loss efforts. She has agreed to following a lower carbohydrate, vegetable and lean protein rich diet plan.  Goal of 50 g of carbs, 1 day at most.   Exercise goals: All adults should avoid inactivity. Some physical activity is better than none, and adults who participate in any amount of physical activity gain some health benefits.  Behavioral modification strategies: increasing lean protein intake, meal planning and cooking strategies, keeping healthy foods in the home, better snacking choices, and planning for success.  Ashley [name] has agreed to follow-up with our clinic in 4 weeks. She was informed of the importance of frequent follow-up visits to maximize her success with intensive lifestyle modifications for her multiple health conditions.   Objective:   Blood pressure 102/70, pulse 91, temperature 98.2 F (36.8 C), height  (1.626 m), weight 163 lb (73.9 kg), SpO2 99 %. Body mass index is 27.98 kg/m.  General: Cooperative, alert, well developed, in no acute distress. HEENT: Conjunctivae and lids unremarkable. Cardiovascular: Regular rhythm.  Lungs: Normal work of breathing. Neurologic: No focal deficits.   Lab Results  Component Value Date   CREATININE 0.87 09/19/2022   BUN 13 09/19/2022   NA 138 09/19/2022   K 3.9 09/19/2022   CL 105 09/19/2022   CO2 19 (L) 09/19/2022   Lab Results  Component Value Date   ALT  12 09/19/2022   AST 18 09/19/2022   ALKPHOS 86 09/19/2022   BILITOT 0.4 09/19/2022   Lab Results  Component Value Date   HGBA1C 5.5 09/19/2022   HGBA1C 5.3 01/05/2022   HGBA1C 5.3 07/14/2021   HGBA1C 5.8 (H) 03/17/2015   Lab Results  Component Value Date   INSULIN 6.4 09/19/2022   INSULIN 7.0 01/05/2022   INSULIN 4.8 07/14/2021   Lab Results  Component Value Date   TSH 1.918 03/17/2015   Lab Results  Component Value Date   CHOL 257 (H) 09/19/2022   HDL 93 09/19/2022   LDLCALC 154 (H)  09/19/2022   TRIG 64 09/19/2022   Lab Results  Component Value Date   VD25OH 37.1 09/19/2022   VD25OH 47.8 01/05/2022   VD25OH 35.9 07/14/2021   Lab Results  Component Value Date   WBC 13.2 (H) 11/08/2016   HGB 12.3 11/08/2016   HCT 37.3 11/08/2016   MCV 83.4 11/08/2016   PLT 281 11/08/2016   No results found for: "IRON", "TIBC", "FERRITIN"  Attestation Statements:   Reviewed by clinician on day of visit: allergies, medications, problem list, medical history, surgical history, family history, social history, and previous encounter notes.  I, Malcolm Metro, RMA, am acting as transcriptionist for Reuben Likes, MD.  I have reviewed the above documentation for accuracy and completeness, and I agree with the above. - Reuben Likes, MD

## 2022-12-25 ENCOUNTER — Encounter (INDEPENDENT_AMBULATORY_CARE_PROVIDER_SITE_OTHER): Payer: Self-pay | Admitting: Family Medicine

## 2022-12-25 ENCOUNTER — Ambulatory Visit (INDEPENDENT_AMBULATORY_CARE_PROVIDER_SITE_OTHER): Payer: 59 | Admitting: Family Medicine

## 2022-12-25 VITALS — BP 108/68 | HR 85 | Temp 98.3°F | Ht 64.0 in | Wt 163.0 lb

## 2022-12-25 DIAGNOSIS — Z6828 Body mass index (BMI) 28.0-28.9, adult: Secondary | ICD-10-CM

## 2022-12-25 DIAGNOSIS — R632 Polyphagia: Secondary | ICD-10-CM | POA: Diagnosis not present

## 2022-12-25 DIAGNOSIS — Z6827 Body mass index (BMI) 27.0-27.9, adult: Secondary | ICD-10-CM | POA: Diagnosis not present

## 2022-12-25 DIAGNOSIS — E669 Obesity, unspecified: Secondary | ICD-10-CM

## 2022-12-25 DIAGNOSIS — E7849 Other hyperlipidemia: Secondary | ICD-10-CM

## 2022-12-25 MED ORDER — TOPIRAMATE 100 MG PO TABS: 100.0000 mg | ORAL_TABLET | Freq: Every day | ORAL | 0 refills | Status: DC

## 2022-12-25 MED ORDER — PHENTERMINE HCL 15 MG PO CAPS: 15.0000 mg | ORAL_CAPSULE | Freq: Every day | ORAL | 0 refills | Status: DC

## 2022-12-25 NOTE — Progress Notes (Signed)
Chief Complaint:   OBESITY Ashley [name] is here to discuss her progress with her obesity treatment plan along with follow-up of her obesity related diagnoses. Ashley [name] is on following a lower carbohydrate, vegetable and lean protein rich diet plan and states she is following her eating plan approximately 65-70% of the time. Ashley [name] states she is walking and doing leg exercises  for 10 minutes 3-7 times per week.  Today's visit was #: 23 Starting weight: 176 lbs Starting date: 04/13/2022 Today's weight: 163 lbs Today's date: 12/25/2022 Total lbs lost to date: 13 Total lbs lost since last in-office visit: 0  Interim History: Patient has been eating out frequently and traveling.  She mentions the next month she will have more celebrations and eating out.  She mentions that its been more carbs than would be allowable in the low carb plan.  She does think that she is experiencing some constipation as well and had a bad experience with a laxative.  She thinks she is likely getting all her total carb (greater than 30) in the am with grits.   Subjective:   1. Other hyperlipidemia Ashley [name]'s last LDL was 154 but HDL was surprisingly at 93. (No history of thyroid disorder/autoimmune disorder, or alcoholism). She is not on medications.    2. Ashley [name] unfortunately has still been taking in more carbohydrates than other macros. Her protein is still low. PDMP was checked with no concerns. She is down 12 lbs since starting medications.   Assessment/Plan:   1. Other hyperlipidemia We will repeat labs in July.   2. Ashley [name] will continue her medications, and we will refill phentermine and Topamax for 1 month.   - phentermine 15 MG capsule; Take 1 capsule (15 mg total) by mouth daily.  Dispense: 30 capsule; Refill: 0 - topiramate (TOPAMAX) 100 MG tablet; Take 1 tablet (100 mg total) by mouth daily.  Dispense: 30 tablet; Refill: 0  3. BMI 28.0-28.9,adult  4. Obesity with starting  BMI of 30.2 Ashley [name] is currently in the action stage of change. As such, her goal is to continue with weight loss efforts. She has agreed to keeping a food journal and adhering to recommended goals of 1200 calories and 80+ grams of protein daily and following a lower carbohydrate, vegetable and lean protein rich diet plan.   Modified Keto handout was given.   Exercise goals: As is.   Behavioral modification strategies: increasing lean protein intake, decreasing eating out, and meal planning and cooking strategies.  Ashley [name] has agreed to follow-up with our clinic in 4 weeks. She was informed of the importance of frequent follow-up visits to maximize her success with intensive lifestyle modifications for her multiple health conditions.   Objective:   Blood pressure 108/68, pulse 85, temperature 98.3 F (36.8 C), height 5\' 4"  (1.626 m), weight 163 lb (73.9 kg), SpO2 100 %. Body mass index is 27.98 kg/m.  General: Cooperative, alert, well developed, in no acute distress. HEENT: Conjunctivae and lids unremarkable. Cardiovascular: Regular rhythm.  Lungs: Normal work of breathing. Neurologic: No focal deficits.   Lab Results  Component Value Date   CREATININE 0.87 09/19/2022   BUN 13 09/19/2022   NA 138 09/19/2022   K 3.9 09/19/2022   CL 105 09/19/2022   CO2 19 (L) 09/19/2022   Lab Results  Component Value Date   ALT 12 09/19/2022   AST 18 09/19/2022   ALKPHOS 86 09/19/2022   BILITOT 0.4 09/19/2022   Lab Results  Component Value  Date   HGBA1C 5.5 09/19/2022   HGBA1C 5.3 01/05/2022   HGBA1C 5.3 07/14/2021   HGBA1C 5.8 (H) 03/17/2015   Lab Results  Component Value Date   INSULIN 6.4 09/19/2022   INSULIN 7.0 01/05/2022   INSULIN 4.8 07/14/2021   Lab Results  Component Value Date   TSH 1.918 03/17/2015   Lab Results  Component Value Date   CHOL 257 (H) 09/19/2022   HDL 93 09/19/2022   LDLCALC 154 (H) 09/19/2022   TRIG 64 09/19/2022   Lab Results  Component Value  Date   VD25OH 37.1 09/19/2022   VD25OH 47.8 01/05/2022   VD25OH 35.9 07/14/2021   Lab Results  Component Value Date   WBC 13.2 (H) 11/08/2016   HGB 12.3 11/08/2016   HCT 37.3 11/08/2016   MCV 83.4 11/08/2016   PLT 281 11/08/2016   No results found for: "IRON", "TIBC", "FERRITIN"  Attestation Statements:   Reviewed by clinician on day of visit: allergies, medications, problem list, medical history, surgical history, family history, social history, and previous encounter notes.   I, Burt Knack, am acting as transcriptionist for Ashley Likes, MD.  I have reviewed the above documentation for accuracy and completeness, and I agree with the above. - Ashley Likes, MD

## 2023-01-22 ENCOUNTER — Ambulatory Visit (INDEPENDENT_AMBULATORY_CARE_PROVIDER_SITE_OTHER): Payer: 59 | Admitting: Family Medicine

## 2023-01-22 ENCOUNTER — Encounter (INDEPENDENT_AMBULATORY_CARE_PROVIDER_SITE_OTHER): Payer: Self-pay | Admitting: Family Medicine

## 2023-01-22 VITALS — BP 98/64 | HR 86 | Temp 98.4°F | Ht 64.0 in | Wt 162.0 lb

## 2023-01-22 DIAGNOSIS — E669 Obesity, unspecified: Secondary | ICD-10-CM | POA: Diagnosis not present

## 2023-01-22 DIAGNOSIS — E7849 Other hyperlipidemia: Secondary | ICD-10-CM

## 2023-01-22 DIAGNOSIS — R632 Polyphagia: Secondary | ICD-10-CM

## 2023-01-22 DIAGNOSIS — Z6827 Body mass index (BMI) 27.0-27.9, adult: Secondary | ICD-10-CM | POA: Diagnosis not present

## 2023-01-22 MED ORDER — TOPIRAMATE 100 MG PO TABS: 100.0000 mg | ORAL_TABLET | Freq: Every day | ORAL | 0 refills | Status: DC

## 2023-01-22 MED ORDER — PHENTERMINE HCL 15 MG PO CAPS: 15.0000 mg | ORAL_CAPSULE | Freq: Every day | ORAL | 0 refills | Status: DC

## 2023-01-22 NOTE — Progress Notes (Unsigned)
Chief Complaint:   OBESITY Ashley [name] is here to discuss her progress with her obesity treatment plan along with follow-up of her obesity related diagnoses. Ashley [name] is on keeping a food journal and adhering to recommended goals of 1200 calories and 80+ grams of protein and following a lower carbohydrate, vegetable and lean protein rich diet plan and states she is following her eating plan approximately 80% of the time. Ashley [name] states she is doing pilates, and walking the dog for 20-30 minutes 3-4 times per week.  Today's visit was #: 24 Starting weight: 176 lbs Starting date: 04/13/2022 Today's weight: 162 lbs Today's date: 01/22/2023 Total lbs lost to date: 14 Total lbs lost since last in-office visit: 1  Interim History: Patient had a good Memorial Day weekend and went [employer].  Her birthday was last week and she worked and then her son too her out to eat.  She is cooking for her dad again and so she has eaten more protein.  She has stopped eating late and if she does eat it will be something light.  She is starting pilates and is trying to get some exercise in.   Subjective:   1. Polyphagia Patient is on combination phentermine and topiramate. Starting weight of 175 lbs and now at 162 lbs. PDMP was checked with no concerns.   2. Other hyperlipidemia Patient's last LDL was 154, triglycerides 64, and HDL 93. She is not on medications.   Assessment/Plan:   1. Polyphagia We will refill phentermine 15 mg, and topiramate 100 mg for 1 month.   - phentermine 15 MG capsule; Take 1 capsule (15 mg total) by mouth daily.  Dispense: 30 capsule; Refill: 0 - topiramate (TOPAMAX) 100 MG tablet; Take 1 tablet (100 mg total) by mouth daily.  Dispense: 30 tablet; Refill: 0  2. Other hyperlipidemia We will repeat fasting labs in August.   3. BMI 27.0-27.9,adult  4. Obesity with starting BMI of 30.2 Ashley [name] is currently in the action stage of change. As such, her goal is to continue with  weight loss efforts. She has agreed to keeping a food journal and adhering to recommended goals of 1150-1250 calories and 80+ grams of protein daily.   Exercise goals: All adults should avoid inactivity. Some physical activity is better than none, and adults who participate in any amount of physical activity gain some health benefits.  Behavioral modification strategies: increasing lean protein intake, meal planning and cooking strategies, keeping healthy foods in the home, and planning for success.  Ashley [name] has agreed to follow-up with our clinic in 4 weeks. She was informed of the importance of frequent follow-up visits to maximize her success with intensive lifestyle modifications for her multiple health conditions.   Objective:   Blood pressure 98/64, pulse 86, temperature 98.4 F (36.9 C), height 5\' 4"  (1.626 m), weight 162 lb (73.5 kg), last menstrual period 01/06/2023, SpO2 99 %. Body mass index is 27.81 kg/m.  General: Cooperative, alert, well developed, in no acute distress. HEENT: Conjunctivae and lids unremarkable. Cardiovascular: Regular rhythm.  Lungs: Normal work of breathing. Neurologic: No focal deficits.   Lab Results  Component Value Date   CREATININE 0.87 09/19/2022   BUN 13 09/19/2022   NA 138 09/19/2022   K 3.9 09/19/2022   CL 105 09/19/2022   CO2 19 (L) 09/19/2022   Lab Results  Component Value Date   ALT 12 09/19/2022   AST 18 09/19/2022   ALKPHOS 86 09/19/2022   BILITOT 0.4  09/19/2022   Lab Results  Component Value Date   HGBA1C 5.5 09/19/2022   HGBA1C 5.3 01/05/2022   HGBA1C 5.3 07/14/2021   HGBA1C 5.8 (H) 03/17/2015   Lab Results  Component Value Date   INSULIN 6.4 09/19/2022   INSULIN 7.0 01/05/2022   INSULIN 4.8 07/14/2021   Lab Results  Component Value Date   TSH 1.918 03/17/2015   Lab Results  Component Value Date   CHOL 257 (H) 09/19/2022   HDL 93 09/19/2022   LDLCALC 154 (H) 09/19/2022   TRIG 64 09/19/2022   Lab Results   Component Value Date   VD25OH 37.1 09/19/2022   VD25OH 47.8 01/05/2022   VD25OH 35.9 07/14/2021   Lab Results  Component Value Date   WBC 13.2 (H) 11/08/2016   HGB 12.3 11/08/2016   HCT 37.3 11/08/2016   MCV 83.4 11/08/2016   PLT 281 11/08/2016   No results found for: "IRON", "TIBC", "FERRITIN"  Attestation Statements:   Reviewed by clinician on day of visit: allergies, medications, problem list, medical history, surgical history, family history, social history, and previous encounter notes.    I, Burt Knack, am acting as transcriptionist for Reuben Likes, MD.  I have reviewed the above documentation for accuracy and completeness, and I agree with the above. - Reuben Likes, MD

## 2023-02-26 ENCOUNTER — Ambulatory Visit (INDEPENDENT_AMBULATORY_CARE_PROVIDER_SITE_OTHER): Payer: 59 | Admitting: Family Medicine

## 2023-02-26 ENCOUNTER — Encounter (INDEPENDENT_AMBULATORY_CARE_PROVIDER_SITE_OTHER): Payer: Self-pay | Admitting: Family Medicine

## 2023-02-26 VITALS — BP 103/66 | HR 78 | Temp 98.3°F | Ht 64.0 in | Wt 163.0 lb

## 2023-02-26 DIAGNOSIS — E669 Obesity, unspecified: Secondary | ICD-10-CM | POA: Diagnosis not present

## 2023-02-26 DIAGNOSIS — Z6827 Body mass index (BMI) 27.0-27.9, adult: Secondary | ICD-10-CM | POA: Diagnosis not present

## 2023-02-26 DIAGNOSIS — R632 Polyphagia: Secondary | ICD-10-CM | POA: Diagnosis not present

## 2023-02-26 DIAGNOSIS — E7849 Other hyperlipidemia: Secondary | ICD-10-CM | POA: Diagnosis not present

## 2023-02-26 DIAGNOSIS — Z6828 Body mass index (BMI) 28.0-28.9, adult: Secondary | ICD-10-CM

## 2023-02-26 MED ORDER — PHENTERMINE HCL 15 MG PO CAPS: 15.0000 mg | ORAL_CAPSULE | Freq: Every day | ORAL | 0 refills | Status: DC

## 2023-02-26 MED ORDER — TOPIRAMATE 100 MG PO TABS
100.0000 mg | ORAL_TABLET | Freq: Every day | ORAL | 0 refills | Status: DC
Start: 2023-02-26 — End: 2023-03-26

## 2023-02-26 NOTE — Progress Notes (Unsigned)
Chief Complaint:   OBESITY Ashley [name] is here to discuss her progress with her obesity treatment plan along with follow-up of her obesity related diagnoses. Ashley [name] is on keeping a food journal and adhering to recommended goals of 1150-1250 calories and 80+ grams of protein and states she is following her eating plan approximately 35% of the time. Ashley [name] states she is walking for 15-25 minutes 7 times per week.  Today's visit was #: 25 Starting weight: 176 lbs Starting date: 04/13/2022 Today's weight: 163 lbs Today's date: 02/26/2023 Total lbs lost to date: 13 Total lbs lost since last in-office visit: 0  Interim History: Patient had a quiet July 4th and stayed in.  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 She has been logging in between work cases and was logging throughout the day.  She finds herself more capable to log during her work days.  She is going to [facility] for 9 days in the upcoming few weeks. She has banquets in the evening and she will have to make time for lunch.  Subjective:   1. Polyphagia Patient is on combination phentermine and topiramate. Her starting weights was of 175 lbs a year ago. Her weight today is of 163 lbs. No side effects were noted.   2. Other hyperlipidemia Patient's last LDL was 154, triglycerides 64, and HDL 93. She is not on statin.   Assessment/Plan:   1. Polyphagia Patient will continue her medications, and we will refill phentermine 15 mg and topiramate 100 mg for 1 month.   - phentermine 15 MG capsule; Take 1 capsule (15 mg total) by mouth daily.  Dispense: 30 capsule; Refill: 0 - topiramate (TOPAMAX) 100 MG tablet; Take 1 tablet (100 mg total) by mouth daily.  Dispense: 30 tablet; Refill: 0  2. Other hyperlipidemia Patient will continue her current logging; she was encouraged to increase logging to 4 times per week.   3. BMI 28.0-28.9,adult  4. Obesity with starting  BMI of 30.2 Ashley [name] is currently in the action stage of change. As such, her goal is to continue with weight loss efforts. She has agreed to keeping a food journal and adhering to recommended goals of 1150-1250 calories and 80+ grams of protein daily.   Exercise goals: All adults should avoid inactivity. Some physical activity is better than none, and adults who participate in any amount of physical activity gain some health benefits.  Behavioral modification strategies: increasing lean protein intake, meal planning and cooking strategies, keeping healthy foods in the home, planning for success, and keeping a strict food journal.  Ashley [name] has agreed to follow-up with our clinic in 4 weeks. She was informed of the importance of frequent follow-up visits to maximize her success with intensive lifestyle modifications for her multiple health conditions.   Objective:   Blood pressure 103/66, pulse 78, temperature 98.3 F (36.8 C), height 5\' 4"  (1.626 m), weight 163 lb (73.9 kg), SpO2 99%. Body mass index is 27.98 kg/m.  General: Cooperative, alert, well developed, in no acute distress. HEENT: Conjunctivae and lids unremarkable. Cardiovascular: Regular rhythm.  Lungs: Normal work of breathing. Neurologic: No focal deficits.   Lab Results  Component Value Date   CREATININE 0.87 09/19/2022   BUN 13 09/19/2022   NA 138 09/19/2022   K 3.9 09/19/2022   CL 105 09/19/2022   CO2 19 (L) 09/19/2022   Lab Results  Component Value  Date   ALT 12 09/19/2022   AST 18 09/19/2022   ALKPHOS 86 09/19/2022   BILITOT 0.4 09/19/2022   Lab Results  Component Value Date   HGBA1C 5.5 09/19/2022   HGBA1C 5.3 01/05/2022   HGBA1C 5.3 07/14/2021   HGBA1C 5.8 (H) 03/17/2015   Lab Results  Component Value Date   INSULIN 6.4 09/19/2022   INSULIN 7.0 01/05/2022   INSULIN 4.8 07/14/2021   Lab Results  Component Value Date   TSH 1.918 03/17/2015   Lab Results  Component Value Date   CHOL 257 (H)  09/19/2022   HDL 93 09/19/2022   LDLCALC 154 (H) 09/19/2022   TRIG 64 09/19/2022   Lab Results  Component Value Date   VD25OH 37.1 09/19/2022   VD25OH 47.8 01/05/2022   VD25OH 35.9 07/14/2021   Lab Results  Component Value Date   WBC 13.2 (H) 11/08/2016   HGB 12.3 11/08/2016   HCT 37.3 11/08/2016   MCV 83.4 11/08/2016   PLT 281 11/08/2016   No results found for: "IRON", "TIBC", "FERRITIN"  Attestation Statements:   Reviewed by clinician on day of visit: allergies, medications, problem list, medical history, surgical history, family history, social history, and previous encounter notes.   I, Burt Knack, am acting as transcriptionist for Reuben Likes, MD.  I have reviewed the above documentation for accuracy and completeness, and I agree with the above. - Reuben Likes, MD

## 2023-03-26 ENCOUNTER — Ambulatory Visit (INDEPENDENT_AMBULATORY_CARE_PROVIDER_SITE_OTHER): Payer: 59 | Admitting: Family Medicine

## 2023-03-26 ENCOUNTER — Encounter (INDEPENDENT_AMBULATORY_CARE_PROVIDER_SITE_OTHER): Payer: Self-pay | Admitting: Family Medicine

## 2023-03-26 VITALS — BP 101/62 | HR 88 | Temp 98.1°F | Ht 64.0 in | Wt 160.0 lb

## 2023-03-26 DIAGNOSIS — E669 Obesity, unspecified: Secondary | ICD-10-CM

## 2023-03-26 DIAGNOSIS — E7849 Other hyperlipidemia: Secondary | ICD-10-CM | POA: Diagnosis not present

## 2023-03-26 DIAGNOSIS — R632 Polyphagia: Secondary | ICD-10-CM

## 2023-03-26 DIAGNOSIS — Z6827 Body mass index (BMI) 27.0-27.9, adult: Secondary | ICD-10-CM | POA: Diagnosis not present

## 2023-03-26 MED ORDER — TOPIRAMATE 100 MG PO TABS
100.0000 mg | ORAL_TABLET | Freq: Every day | ORAL | 0 refills | Status: DC
Start: 2023-03-26 — End: 2023-04-30

## 2023-03-26 MED ORDER — PHENTERMINE HCL 15 MG PO CAPS
15.0000 mg | ORAL_CAPSULE | Freq: Every day | ORAL | 0 refills | Status: DC
Start: 2023-03-26 — End: 2023-04-30

## 2023-03-26 NOTE — Progress Notes (Signed)
Chief Complaint:   OBESITY Ashley [name] is here to discuss her progress with her obesity treatment plan along with follow-up of her obesity related diagnoses. Ashley [name] is on keeping a food journal and adhering to recommended goals of 1150-1250 calories and 80+ grams of protein and states she is following her eating plan approximately 90% of the time. Ashley [name] states she is walking and doing push-ups for 10-20 minutes 7 times per week.  Today's visit was #: 26 Starting weight: 176 lbs Starting date: 04/13/2022 Today's weight: 160 lbs Today's date: 03/26/2023 Total lbs lost to date: 16 Total lbs lost since last in-office visit: 3  Interim History: Since last appointment she did cut out most all bread, rice and potatoes.  She did have an indulgent weekend when she met up with a friend and had a sweet potato and potato skins.  When she went to [facility] she acknowledged some unhealthy habits of family and decided to be more consistent and serious with her journaling.  She went over calories but was getting closer to meeting her protein goal. The next few weeks she has a cookout with family.  Subjective:   1. Polyphagia Patient is on combination phentermine and topiramate.  Her starting weight was of 175 pounds, and she is now at a 160 (down 15 pounds).  No side effects were mentioned.  PDMP was checked with no concerns.  2. Other hyperlipidemia Patient's last LDL was 154, HDL 93, and triglycerides 64.  She is not on medications.  Assessment/Plan:   1. Polyphagia Patient will continue her medications, and we will refill phentermine 15 mg and topiramate 100 mg for 1 month.  - phentermine 15 MG capsule; Take 1 capsule (15 mg total) by mouth daily.  Dispense: 30 capsule; Refill: 0 - topiramate (TOPAMAX) 100 MG tablet; Take 1 tablet (100 mg total) by mouth daily.  Dispense: 30 tablet; Refill: 0  2. Other hyperlipidemia Patient will have labs repeated with her PCP in mid September.  We will discuss  labs after that appointment.  3. BMI 27.0-27.9,adult  4. Obesity with starting BMI of 30.2 Ashley [name] is currently in the action stage of change. As such, her goal is to continue with weight loss efforts. She has agreed to keeping a food journal and adhering to recommended goals of 1150-1250 calories and 80+ grams of protein daily.   Exercise goals: All adults should avoid inactivity. Some physical activity is better than none, and adults who participate in any amount of physical activity gain some health benefits.  Behavioral modification strategies: increasing lean protein intake, increasing vegetables, meal planning and cooking strategies, and keeping a strict food journal.  Ashley [name] has agreed to follow-up with our clinic in 4 weeks. She was informed of the importance of frequent follow-up visits to maximize her success with intensive lifestyle modifications for her multiple health conditions.   Objective:   Blood pressure 101/62, pulse 88, temperature 98.1 F (36.7 C), height 5\' 4"  (1.626 m), weight 160 lb (72.6 kg), last menstrual period 03/03/2023, SpO2 100%. Body mass index is 27.46 kg/m.  General: Cooperative, alert, well developed, in no acute distress. HEENT: Conjunctivae and lids unremarkable. Cardiovascular: Regular rhythm.  Lungs: Normal work of breathing. Neurologic: No focal deficits.   Lab Results  Component Value Date   CREATININE 0.87 09/19/2022   BUN 13 09/19/2022   NA 138 09/19/2022   K 3.9 09/19/2022   CL 105 09/19/2022   CO2 19 (L) 09/19/2022   Lab Results  Component Value Date   ALT 12 09/19/2022   AST 18 09/19/2022   ALKPHOS 86 09/19/2022   BILITOT 0.4 09/19/2022   Lab Results  Component Value Date   HGBA1C 5.5 09/19/2022   HGBA1C 5.3 01/05/2022   HGBA1C 5.3 07/14/2021   HGBA1C 5.8 (H) 03/17/2015   Lab Results  Component Value Date   INSULIN 6.4 09/19/2022   INSULIN 7.0 01/05/2022   INSULIN 4.8 07/14/2021   Lab Results  Component Value Date    TSH 1.918 03/17/2015   Lab Results  Component Value Date   CHOL 257 (H) 09/19/2022   HDL 93 09/19/2022   LDLCALC 154 (H) 09/19/2022   TRIG 64 09/19/2022   Lab Results  Component Value Date   VD25OH 37.1 09/19/2022   VD25OH 47.8 01/05/2022   VD25OH 35.9 07/14/2021   Lab Results  Component Value Date   WBC 13.2 (H) 11/08/2016   HGB 12.3 11/08/2016   HCT 37.3 11/08/2016   MCV 83.4 11/08/2016   PLT 281 11/08/2016   No results found for: "IRON", "TIBC", "FERRITIN"  Attestation Statements:   Reviewed by clinician on day of visit: allergies, medications, problem list, medical history, surgical history, family history, social history, and previous encounter notes.   I, Burt Knack, am acting as transcriptionist for Reuben Likes, MD.  I have reviewed the above documentation for accuracy and completeness, and I agree with the above. - Reuben Likes, MD

## 2023-04-30 ENCOUNTER — Ambulatory Visit (INDEPENDENT_AMBULATORY_CARE_PROVIDER_SITE_OTHER): Payer: 59 | Admitting: Family Medicine

## 2023-04-30 ENCOUNTER — Encounter (INDEPENDENT_AMBULATORY_CARE_PROVIDER_SITE_OTHER): Payer: Self-pay | Admitting: Family Medicine

## 2023-04-30 VITALS — BP 106/66 | HR 93 | Temp 98.3°F | Ht 64.0 in | Wt 162.0 lb

## 2023-04-30 DIAGNOSIS — E559 Vitamin D deficiency, unspecified: Secondary | ICD-10-CM

## 2023-04-30 DIAGNOSIS — R7303 Prediabetes: Secondary | ICD-10-CM

## 2023-04-30 DIAGNOSIS — E669 Obesity, unspecified: Secondary | ICD-10-CM | POA: Diagnosis not present

## 2023-04-30 DIAGNOSIS — R632 Polyphagia: Secondary | ICD-10-CM

## 2023-04-30 DIAGNOSIS — Z6827 Body mass index (BMI) 27.0-27.9, adult: Secondary | ICD-10-CM

## 2023-04-30 MED ORDER — TOPIRAMATE 100 MG PO TABS
100.0000 mg | ORAL_TABLET | Freq: Every day | ORAL | 0 refills | Status: DC
Start: 2023-04-30 — End: 2023-05-30

## 2023-04-30 MED ORDER — PHENTERMINE HCL 15 MG PO CAPS
15.0000 mg | ORAL_CAPSULE | Freq: Every day | ORAL | 0 refills | Status: DC
Start: 2023-04-30 — End: 2023-05-30

## 2023-04-30 NOTE — Progress Notes (Unsigned)
Chief Complaint:   OBESITY Ashley [name] is here to discuss her progress with her obesity treatment plan along with follow-up of her obesity related diagnoses. Ashley [name] is on keeping a food journal and adhering to recommended goals of 1150-1250 calories and 80+ grams of protein and states she is following her eating plan approximately 80% of the time. Ashley [name] states she is doing pilates and walking for 10-20 minutes 3-7 times per week.  Today's visit was #: 27 Starting weight: 176 lbs Starting date: 04/13/2022 Today's weight: 162 lbs Today's date: 04/30/2023 Total lbs lost to date: 14 Total lbs lost since last in-office visit: 0  Interim History: Since last appointment patient has been trying to stay focused "one day at a time".  She is eating at home more and eating more vegetables.  She cut out carbohydrates and didn't notice much weight loss.  She started pilates and is noticing more ton. She doesn't think she is getting all her protein in but she is getting more in than she used to.  She is working on increasing protein and increasing her water consumption.  She got labs done last week and her vitamin d was higher.  Subjective:   1. Vitamin D deficiency Patient is on OTC vitamin D 5000 IU daily, and she notes fatigue.  Her last vitamin D level was of 37 on 04/26/2023 with Dr. Paulino Rily.  2. Prediabetes Patient's last A1c was within normal limits at 5.4 with her PCP.  She notes some carbohydrate cravings and some incorporation of carbohydrates back into her intake.  3. Polyphagia Patient is on combination phentermine and topiramate.  Her starting weight was of 175 pounds and she is now at 162 pounds.  Assessment/Plan:   1. Vitamin D deficiency Patient agreed to increase to alternating 5,000 IU and 10,000 IU every other day.  We will repeat labs in 5 months.  2. Prediabetes Patient will continue food journaling with no change in plan.   3. Polyphagia Patient will continue her  medications, and we will refill phentermine 15 mg and Topamax 100 mg for 1 month.  - topiramate (TOPAMAX) 100 MG tablet; Take 1 tablet (100 mg total) by mouth daily.  Dispense: 30 tablet; Refill: 0 - phentermine 15 MG capsule; Take 1 capsule (15 mg total) by mouth daily.  Dispense: 30 capsule; Refill: 0  4. BMI 27.0-27.9,adult  5. Obesity with starting BMI of 30.2 Ashley [name] is currently in the action stage of change. As such, her goal is to continue with weight loss efforts. She has agreed to keeping a food journal and adhering to recommended goals of 1150-1250 calories and 80+ grams of protein daily.   Exercise goals: All adults should avoid inactivity. Some physical activity is better than none, and adults who participate in any amount of physical activity gain some health benefits.  Behavioral modification strategies: increasing lean protein intake, meal planning and cooking strategies, keeping healthy foods in the home, and keeping a strict food journal.  Ashley [name] has agreed to follow-up with our clinic in 4 weeks. She was informed of the importance of frequent follow-up visits to maximize her success with intensive lifestyle modifications for her multiple health conditions.   Objective:   Blood pressure 106/66, pulse 93, temperature 98.3 F (36.8 C), height 5\' 4"  (1.626 m), weight 162 lb (73.5 kg), SpO2 99%. Body mass index is 27.81 kg/m.  General: Cooperative, alert, well developed, in no acute distress. HEENT: Conjunctivae and lids unremarkable. Cardiovascular: Regular rhythm.  Lungs:  Normal work of breathing. Neurologic: No focal deficits.   Lab Results  Component Value Date   CREATININE 0.87 09/19/2022   BUN 13 09/19/2022   NA 138 09/19/2022   K 3.9 09/19/2022   CL 105 09/19/2022   CO2 19 (L) 09/19/2022   Lab Results  Component Value Date   ALT 12 09/19/2022   AST 18 09/19/2022   ALKPHOS 86 09/19/2022   BILITOT 0.4 09/19/2022   Lab Results  Component Value Date    HGBA1C 5.5 09/19/2022   HGBA1C 5.3 01/05/2022   HGBA1C 5.3 07/14/2021   HGBA1C 5.8 (H) 03/17/2015   Lab Results  Component Value Date   INSULIN 6.4 09/19/2022   INSULIN 7.0 01/05/2022   INSULIN 4.8 07/14/2021   Lab Results  Component Value Date   TSH 1.918 03/17/2015   Lab Results  Component Value Date   CHOL 257 (H) 09/19/2022   HDL 93 09/19/2022   LDLCALC 154 (H) 09/19/2022   TRIG 64 09/19/2022   Lab Results  Component Value Date   VD25OH 37.1 09/19/2022   VD25OH 47.8 01/05/2022   VD25OH 35.9 07/14/2021   Lab Results  Component Value Date   WBC 13.2 (H) 11/08/2016   HGB 12.3 11/08/2016   HCT 37.3 11/08/2016   MCV 83.4 11/08/2016   PLT 281 11/08/2016   No results found for: "IRON", "TIBC", "FERRITIN"  Attestation Statements:   Reviewed by clinician on day of visit: allergies, medications, problem list, medical history, surgical history, family history, social history, and previous encounter notes.   I, Burt Knack, am acting as transcriptionist for Reuben Likes, MD.  I have reviewed the above documentation for accuracy and completeness, and I agree with the above. - Reuben Likes, MD

## 2023-05-30 ENCOUNTER — Ambulatory Visit (INDEPENDENT_AMBULATORY_CARE_PROVIDER_SITE_OTHER): Payer: 59 | Admitting: Family Medicine

## 2023-05-30 ENCOUNTER — Encounter (INDEPENDENT_AMBULATORY_CARE_PROVIDER_SITE_OTHER): Payer: Self-pay | Admitting: Family Medicine

## 2023-05-30 VITALS — BP 110/64 | HR 74 | Temp 98.2°F | Ht 64.0 in | Wt 163.0 lb

## 2023-05-30 DIAGNOSIS — Z6828 Body mass index (BMI) 28.0-28.9, adult: Secondary | ICD-10-CM

## 2023-05-30 DIAGNOSIS — E7849 Other hyperlipidemia: Secondary | ICD-10-CM | POA: Diagnosis not present

## 2023-05-30 DIAGNOSIS — R632 Polyphagia: Secondary | ICD-10-CM | POA: Diagnosis not present

## 2023-05-30 DIAGNOSIS — E559 Vitamin D deficiency, unspecified: Secondary | ICD-10-CM | POA: Diagnosis not present

## 2023-05-30 DIAGNOSIS — E669 Obesity, unspecified: Secondary | ICD-10-CM | POA: Diagnosis not present

## 2023-05-30 DIAGNOSIS — E66811 Obesity, class 1: Secondary | ICD-10-CM

## 2023-05-30 MED ORDER — VITAMIN D (ERGOCALCIFEROL) 1.25 MG (50000 UNIT) PO CAPS
50000.0000 [IU] | ORAL_CAPSULE | ORAL | 0 refills | Status: DC
Start: 2023-05-30 — End: 2023-07-16

## 2023-05-30 NOTE — Progress Notes (Signed)
Chief Complaint:   OBESITY Ashley [name] is here to discuss her progress with her obesity treatment plan along with follow-up of her obesity related diagnoses. Ashley [name] is on keeping a food journal and adhering to recommended goals of 1150-1250 calories and 80+ grams of protein and states she is following her eating plan approximately 80% of the time. Ashley [name] states she is doing core exercise for 5 minutes 3-4 times per week.  Today's visit was #: 28 Starting weight: 176 lbs Starting date: 04/13/2022 Today's weight: 163 lbs Today's date: 05/30/2023 Total lbs lost to date: 13 Total lbs lost since last in-office visit: 0  Interim History: Patient has been just trying to get through her daily routine.  She went [place] to [place].  She was able to get away for a few days.  For the last few weeks she has been trying to remember to drink more water and eat her protein every day. She has forgotten to eat a few days. She has been buying more protein sources that are quick to prepare so she can ensure she eats consistently. This weekend is central [place] homecoming and she will be hanging out in [place] then [place].  She applied to work seasonally at [employer] and is waiting to hear on that.  Subjective:   1. Vitamin D deficiency Patient is on OTC vitamin D 5000 IU daily, but upon review of labs from her PCP she is still deficient.  She has not been consistent with her vitamin D supplementation.  2. Other hyperlipidemia Reviewed labs from patient's PCP and her LDL was 172 (up from 154 from February).  Her HDL was higher and normal.  I discussed labs with the patient today.  3. Polyphagia Patient is on combination phentermine and topiramate.  Her starting weight was up 175 pounds and she has lost 12 pounds; now at max dose.  Assessment/Plan:   1. Vitamin D deficiency Patient agreed to start prescription vitamin D 50,000 IU once weekly with no  refills.  - Vitamin D, Ergocalciferol, (DRISDOL) 1.25 MG (50000 UNIT) CAPS capsule; Take 1 capsule (50,000 Units total) by mouth every 7 (seven) days.  Dispense: 4 capsule; Refill: 0  2. Other hyperlipidemia Patient will continue to follow-up with her PCP.  3. Polyphagia Patient will continue her medications, and we will refill phentermine 15 mg once daily and topiramate 100 mg once daily for 1 month.  - topiramate (TOPAMAX) 100 MG tablet; Take 1 tablet (100 mg total) by mouth daily.  Dispense: 30 tablet; Refill: 0 - phentermine 15 MG capsule; Take 1 capsule (15 mg total) by mouth daily.  Dispense: 30 capsule; Refill: 0  4. BMI 28.0-28.9,adult  5. Obesity with starting BMI of 30.2 Ashley [name] is currently in the action stage of change. As such, her goal is to continue with weight loss efforts. She has agreed to keeping a food journal and adhering to recommended goals of 1150-1250 calories and 80+ grams of protein daily.   Exercise goals: All adults should avoid inactivity. Some physical activity is better than none, and adults who participate in any amount of physical activity gain some health benefits.  Behavioral modification strategies: increasing lean protein intake, meal planning and cooking strategies, keeping healthy foods in the home, and planning for success.  Ashley [name] has agreed to follow-up with our clinic in 4 weeks. She was informed of the importance of frequent follow-up visits to maximize her success with intensive lifestyle modifications for  her multiple health conditions.   Objective:   Blood pressure 110/64, pulse 74, temperature 98.2 F (36.8 C), height 5\' 4"  (1.626 m), weight 163 lb (73.9 kg), last menstrual period 05/28/2023, SpO2 100%. Body mass index is 27.98 kg/m.  General: Cooperative, alert, well developed, in no acute distress. HEENT: Conjunctivae and lids unremarkable. Cardiovascular: Regular rhythm.  Lungs: Normal work of breathing. Neurologic: No focal  deficits.   Lab Results  Component Value Date   CREATININE 0.87 09/19/2022   BUN 13 09/19/2022   NA 138 09/19/2022   K 3.9 09/19/2022   CL 105 09/19/2022   CO2 19 (L) 09/19/2022   Lab Results  Component Value Date   ALT 12 09/19/2022   AST 18 09/19/2022   ALKPHOS 86 09/19/2022   BILITOT 0.4 09/19/2022   Lab Results  Component Value Date   HGBA1C 5.5 09/19/2022   HGBA1C 5.3 01/05/2022   HGBA1C 5.3 07/14/2021   HGBA1C 5.8 (H) 03/17/2015   Lab Results  Component Value Date   INSULIN 6.4 09/19/2022   INSULIN 7.0 01/05/2022   INSULIN 4.8 07/14/2021   Lab Results  Component Value Date   TSH 1.918 03/17/2015   Lab Results  Component Value Date   CHOL 257 (H) 09/19/2022   HDL 93 09/19/2022   LDLCALC 154 (H) 09/19/2022   TRIG 64 09/19/2022   Lab Results  Component Value Date   VD25OH 37.1 09/19/2022   VD25OH 47.8 01/05/2022   VD25OH 35.9 07/14/2021   Lab Results  Component Value Date   WBC 13.2 (H) 11/08/2016   HGB 12.3 11/08/2016   HCT 37.3 11/08/2016   MCV 83.4 11/08/2016   PLT 281 11/08/2016   No results found for: "IRON", "TIBC", "FERRITIN"  Attestation Statements:   Reviewed by clinician on day of visit: allergies, medications, problem list, medical history, surgical history, family history, social history, and previous encounter notes.   I, Burt Knack, am acting as transcriptionist for Reuben Likes, MD.  I have reviewed the above documentation for accuracy and completeness, and I agree with the above. - Reuben Likes, MD

## 2023-05-31 ENCOUNTER — Encounter (HOSPITAL_COMMUNITY): Payer: Self-pay | Admitting: *Deleted

## 2023-06-03 MED ORDER — PHENTERMINE HCL 15 MG PO CAPS
15.0000 mg | ORAL_CAPSULE | Freq: Every day | ORAL | 0 refills | Status: DC
Start: 2023-06-03 — End: 2023-06-21

## 2023-06-03 MED ORDER — TOPIRAMATE 100 MG PO TABS
100.0000 mg | ORAL_TABLET | Freq: Every day | ORAL | 0 refills | Status: DC
Start: 2023-06-03 — End: 2023-06-21

## 2023-06-21 ENCOUNTER — Ambulatory Visit (INDEPENDENT_AMBULATORY_CARE_PROVIDER_SITE_OTHER): Payer: 59 | Admitting: Family Medicine

## 2023-06-21 ENCOUNTER — Encounter (INDEPENDENT_AMBULATORY_CARE_PROVIDER_SITE_OTHER): Payer: Self-pay | Admitting: Family Medicine

## 2023-06-21 VITALS — BP 108/74 | HR 95 | Temp 98.5°F | Ht 64.0 in | Wt 162.0 lb

## 2023-06-21 DIAGNOSIS — R632 Polyphagia: Secondary | ICD-10-CM

## 2023-06-21 DIAGNOSIS — E66811 Obesity, class 1: Secondary | ICD-10-CM

## 2023-06-21 DIAGNOSIS — Z6827 Body mass index (BMI) 27.0-27.9, adult: Secondary | ICD-10-CM | POA: Diagnosis not present

## 2023-06-21 DIAGNOSIS — E559 Vitamin D deficiency, unspecified: Secondary | ICD-10-CM | POA: Diagnosis not present

## 2023-06-21 DIAGNOSIS — E669 Obesity, unspecified: Secondary | ICD-10-CM | POA: Diagnosis not present

## 2023-06-21 MED ORDER — PHENTERMINE HCL 15 MG PO CAPS
15.0000 mg | ORAL_CAPSULE | Freq: Every day | ORAL | 0 refills | Status: DC
Start: 2023-06-21 — End: 2023-07-16

## 2023-06-21 MED ORDER — TOPIRAMATE 100 MG PO TABS
100.0000 mg | ORAL_TABLET | Freq: Every day | ORAL | 0 refills | Status: DC
Start: 2023-06-21 — End: 2023-07-16

## 2023-06-21 NOTE — Assessment & Plan Note (Signed)
Patient on combination of phentermine/ topiramate at the 15/100 dosage combination.  Started 1st treatment dose on 02/03/22 at 175lb.  She is now at 162lb which is more than the 5%.  No side effects mentioned and she is still experiencing more control over food intake.  She is working on increasing total protein intake. One day she forgot her medication and she noticed a significant increase in her food intake.

## 2023-06-21 NOTE — Assessment & Plan Note (Signed)
Discussed importance of vitamin d supplementation.  Vitamin d supplementation has been shown to decrease fatigue, decrease risk of progression to insulin resistance and then prediabetes, decreases risk of falling in older age and can even assist in decreasing depressive symptoms in PTSD.   Patient last lab in February so patient to transition to OTC and will get labs done at next appointment before refilling prescription strength Vitamin D.

## 2023-06-21 NOTE — Progress Notes (Signed)
SUBJECTIVE:  Chief Complaint: Obesity  Interim History: Over the last few weeks patient has been mostly working and increase her protein intake and drink more water.  She is also preparing for the cold weather as she doesn't care much for cold weather. She is working on increasing total protein intake at every meal.  She is drinking more water as well because she is in the house more.  She wants to get protein shakes to keep on hand. She is going to her daughters for Thanksgiving.  She is planning to take a few leftover containers to bring some of the food back with her. She has stopped smoking again.  Ashley [name] is here to discuss her progress with her obesity treatment plan. She is on the keeping a food journal and adhering to recommended goals of 1150-1250 calories and 80+ grams of protein and states she is following her eating plan approximately 70 % of the time. She states she is exercising 15-30 minutes 3-5 times per week.   OBJECTIVE: Visit Diagnoses: Problem List Items Addressed This Visit       Other   Obesity - Primary   Relevant Medications   phentermine 15 MG capsule   Polyphagia    Patient on combination of phentermine/ topiramate at the 15/100 dosage combination.  Started 1st treatment dose on 02/03/22 at 175lb.  She is now at 162lb which is more than the 5%.  No side effects mentioned and she is still experiencing more control over food intake.  She is working on increasing total protein intake. One day she forgot her medication and she noticed a significant increase in her food intake.       Relevant Medications   phentermine 15 MG capsule   topiramate (TOPAMAX) 100 MG tablet   Vitamin D deficiency    Discussed importance of vitamin d supplementation.  Vitamin d supplementation has been shown to decrease fatigue, decrease risk of progression to insulin resistance and then prediabetes, decreases risk of falling in older age and can even assist in decreasing depressive  symptoms in PTSD.   Patient last lab in February so patient to transition to OTC and will get labs done at next appointment before refilling prescription strength Vitamin D.       Other Visit Diagnoses     BMI 27.0-27.9,adult           Vitals Temp: 98.5 F (36.9 C) BP: 108/74 Pulse Rate: 95 SpO2: 100 %   Anthropometric Measurements Height: 5\' 4"  (1.626 m) Weight: 162 lb (73.5 kg) BMI (Calculated): 27.79 Weight at Last Visit: 163 lb Weight Lost Since Last Visit: 1 Weight Gained Since Last Visit: 0 Starting Weight: 176 lb Total Weight Loss (lbs): 14 lb (6.35 kg)   Body Composition  Body Fat %: 35.3 % Fat Mass (lbs): 57.4 lbs Muscle Mass (lbs): 100 lbs Total Body Water (lbs): 68.2 lbs Visceral Fat Rating : 7   Other Clinical Data Today's Visit #: 76 Starting Date: 04/13/21     ASSESSMENT AND PLAN:  Diet: Sherol is currently in the action stage of change. As such, her goal is to continue with weight loss efforts. She has agreed to keeping a food journal and adhering to recommended goals of 1200-1250 calories and 85 or more grams of protein.  Exercise: Kennethia has been instructed that some exercise is better than none for weight loss and overall health benefits.   Behavior Modification:  We discussed the following Behavioral Modification Strategies today: increasing lean  protein intake, increasing vegetables, meal planning and cooking strategies, better snacking choices, and keep a strict food journal. We discussed various medication options to help Wrigley with her weight loss efforts and we both agreed to continue current dose of phentermine and topiramate.  No follow-ups on file.Marland Kitchen She was informed of the importance of frequent follow up visits to maximize her success with intensive lifestyle modifications for her multiple health conditions.  Attestation Statements:   Reviewed by clinician on day of visit: allergies, medications, problem list, medical  history, surgical history, family history, social history, and previous encounter notes.   Rudean Hitt, MD

## 2023-07-16 ENCOUNTER — Ambulatory Visit (INDEPENDENT_AMBULATORY_CARE_PROVIDER_SITE_OTHER): Payer: 59 | Admitting: Family Medicine

## 2023-07-16 VITALS — BP 104/62 | HR 86 | Temp 98.3°F | Ht 64.0 in | Wt 163.0 lb

## 2023-07-16 DIAGNOSIS — Z6827 Body mass index (BMI) 27.0-27.9, adult: Secondary | ICD-10-CM

## 2023-07-16 DIAGNOSIS — E669 Obesity, unspecified: Secondary | ICD-10-CM

## 2023-07-16 DIAGNOSIS — Z6830 Body mass index (BMI) 30.0-30.9, adult: Secondary | ICD-10-CM

## 2023-07-16 DIAGNOSIS — R632 Polyphagia: Secondary | ICD-10-CM

## 2023-07-16 DIAGNOSIS — Z6828 Body mass index (BMI) 28.0-28.9, adult: Secondary | ICD-10-CM

## 2023-07-16 DIAGNOSIS — E559 Vitamin D deficiency, unspecified: Secondary | ICD-10-CM | POA: Diagnosis not present

## 2023-07-16 MED ORDER — VITAMIN D (ERGOCALCIFEROL) 1.25 MG (50000 UNIT) PO CAPS: 50000.0000 [IU] | ORAL_CAPSULE | ORAL | 0 refills | Status: DC

## 2023-07-16 MED ORDER — PHENTERMINE HCL 15 MG PO CAPS: 15.0000 mg | ORAL_CAPSULE | Freq: Every day | ORAL | 0 refills | Status: DC

## 2023-07-16 MED ORDER — TOPIRAMATE 100 MG PO TABS
100.0000 mg | ORAL_TABLET | Freq: Every day | ORAL | 0 refills | Status: DC
Start: 2023-07-16 — End: 2023-08-14

## 2023-07-16 NOTE — Progress Notes (Signed)
SUBJECTIVE:  Chief Complaint: Obesity  Interim History: Patient went to her daughter's for Thanksgiving.  She noticed a 4lb weight gain after her trip to her daughter's for the holiday.  She then had a fast for church that was about 72 hours long and attributes some weight loss to that. Aside from the holiday she has been snacking at night and is trying to focus on higher protein quantity. She has put protein powder on her table to remind her to use it more frequently.  For this month she is going to her sister's and will take advantage that her sister has unflavored protein that she can use. She brought weights with her to her daughter's for Thanksgiving.    Ashley [name] is here to discuss her progress with her obesity treatment plan. She is on the keeping a food journal and adhering to recommended goals of 1200-1250 calories and 85 grams of protein and states she is following her eating plan approximately 80 % of the time. She states she is walking the dog 15 minutes 7 times per week.   OBJECTIVE: Visit Diagnoses: Problem List Items Addressed This Visit       Other   Polyphagia    Patient doing well on combination of phentermine and topiramate.  Down 12lbs since starting medication and able to only gain 1lb during Thanksgiving holiday. Patient denies any side effects and needs a refill today of her medications.       Relevant Medications   topiramate (TOPAMAX) 100 MG tablet   phentermine 15 MG capsule   Vitamin D deficiency - Primary    Patient doing well on Vit D prescription.  She denies nausea, vomiting or muscle weakness. She will get labs at next appointment.  She needs a refill today of her vitamin d.  Last lab in September was 37.      Relevant Medications   Vitamin D, Ergocalciferol, (DRISDOL) 1.25 MG (50000 UNIT) CAPS capsule    Vitals Temp: 98.3 F (36.8 C) BP: 104/62 Pulse Rate: 86 SpO2: 100 %   Anthropometric Measurements Height: 5\' 4"  (1.626 m) Weight: 163 lb  (73.9 kg) BMI (Calculated): 27.97 Weight at Last Visit: 162 lb Weight Lost Since Last Visit: 0 Weight Gained Since Last Visit: 1 Starting Weight: 176 lb Total Weight Loss (lbs): 13 lb (5.897 kg)   Body Composition  Body Fat %: 36 % Fat Mass (lbs): 59 lbs Muscle Mass (lbs): 99.4 lbs Total Body Water (lbs): 67.8 lbs Visceral Fat Rating : 7   Other Clinical Data Fasting: yes Labs: ? Today's Visit #: 30 Starting Date: 04/13/21     ASSESSMENT AND PLAN:  Diet: Ashley [name] is currently in the action stage of change. As such, her goal is to continue with weight loss efforts. She has agreed to keeping a food journal and adhering to recommended goals of 1200-1250 calories and 85 grams or more protein daily.  Exercise: Ashley [name] has been instructed that some exercise is better than none for weight loss and overall health benefits.  She is planning to increase her walking with her dog either at lunch or at a break.   Behavior Modification:  We discussed the following Behavioral Modification Strategies today: increasing lean protein intake, increasing vegetables, better snacking choices, and keep a strict food journal. We discussed various medication options to help Ashley [name] with her weight loss efforts and we both agreed to continue current medications of combination of phentermine and topiramate at same dose.  No follow-ups on file.Marland Kitchen  She was informed of the importance of frequent follow up visits to maximize her success with intensive lifestyle modifications for her multiple health conditions.  Attestation Statements:   Reviewed by clinician on day of visit: allergies, medications, problem list, medical history, surgical history, family history, social history, and previous encounter notes.   Reuben Likes, MD

## 2023-07-16 NOTE — Assessment & Plan Note (Addendum)
Patient doing well on Vit D prescription.  She denies nausea, vomiting or muscle weakness. She will get labs at next appointment.  She needs a refill today of her vitamin d.  Last lab in September was 37.

## 2023-07-16 NOTE — Assessment & Plan Note (Signed)
Patient doing well on combination of phentermine and topiramate.  Down 12lbs since starting medication and able to only gain 1lb during Thanksgiving holiday. Patient denies any side effects and needs a refill today of her medications.

## 2023-08-14 ENCOUNTER — Encounter (INDEPENDENT_AMBULATORY_CARE_PROVIDER_SITE_OTHER): Payer: Self-pay | Admitting: Family Medicine

## 2023-08-14 ENCOUNTER — Ambulatory Visit (INDEPENDENT_AMBULATORY_CARE_PROVIDER_SITE_OTHER): Payer: 59 | Admitting: Family Medicine

## 2023-08-14 VITALS — BP 104/64 | HR 92 | Temp 98.1°F | Ht 64.0 in | Wt 165.0 lb

## 2023-08-14 DIAGNOSIS — R7303 Prediabetes: Secondary | ICD-10-CM

## 2023-08-14 DIAGNOSIS — E559 Vitamin D deficiency, unspecified: Secondary | ICD-10-CM

## 2023-08-14 DIAGNOSIS — R632 Polyphagia: Secondary | ICD-10-CM | POA: Diagnosis not present

## 2023-08-14 DIAGNOSIS — Z6830 Body mass index (BMI) 30.0-30.9, adult: Secondary | ICD-10-CM

## 2023-08-14 DIAGNOSIS — E7849 Other hyperlipidemia: Secondary | ICD-10-CM | POA: Diagnosis not present

## 2023-08-14 DIAGNOSIS — Z6828 Body mass index (BMI) 28.0-28.9, adult: Secondary | ICD-10-CM

## 2023-08-14 DIAGNOSIS — E669 Obesity, unspecified: Secondary | ICD-10-CM

## 2023-08-14 MED ORDER — TOPIRAMATE 100 MG PO TABS: 100.0000 mg | ORAL_TABLET | Freq: Every day | ORAL | 0 refills | Status: DC

## 2023-08-14 NOTE — Progress Notes (Signed)
 SUBJECTIVE:  Chief Complaint: Obesity  Interim History: Patient's dog acted up over the holidays and so her trip to [place]  was cut short.  She otherwise she had a quiet and enjoyable holiday.  She is ready for the winter to bed over.  Foodwise she is focusing on portion control and increasing her water  intake.  She is increasing total protein over the day. Over the past weekend she ate out at [employer], [employer], leftovers from the restaurants.   Ashley [name] is here to discuss her progress with her obesity treatment plan. She is on the practicing portion control and making smarter food choices, such as increasing vegetables and decreasing simple carbohydrates and states she is following her eating plan approximately 80 % of the time. She states she is exercising 20 minutes 3 times per week.   OBJECTIVE: Visit Diagnoses: Problem List Items Addressed This Visit       Other   Prediabetes   Last A1c in epic of 5.5 with barely elevated insulin .  Patient is working on environmental manager and focusing on protein intake and achieving protein goal.Will repeat labs of CMP, A1c and Insulin  level today.  Will plan to discuss labs at next appointment.      Relevant Orders   Comprehensive metabolic panel (Completed)   Hemoglobin A1c (Completed)   Insulin , random (Completed)   Obesity   Relevant Medications   phentermine  15 MG capsule   Polyphagia   Patient started first treatment dose of makeshift qsymia at 175lbs.  She is at 165lbs at this appointment; has achieved more than 5% loss.  She is stable on current medication and is not experiencing any side effects.  Needs refills of her phentermine  and topiramate  today.  PDMP checked and no concerns noted.      Relevant Medications   topiramate  (TOPAMAX ) 100 MG tablet   phentermine  15 MG capsule   Vitamin D  deficiency - Primary   Patient doing well on RX vitamin d .  Vitamin D  level ordered today.  Will discuss lab result a next appointment.       Relevant Orders   VITAMIN D  25 Hydroxy (Vit-D Deficiency, Fractures) (Completed)   Other hyperlipidemia   Last labs done showing elevated LDL.  We had previously discussed importance of controlling cholesterol through dietary changes.  Will repeat fasting lipid panel today and discuss results at next appointment.      Relevant Orders   Lipid Panel With LDL/HDL Ratio (Completed)   Other Visit Diagnoses       BMI 28.0-28.9,adult           No data recorded No data recorded  No data recorded No data recorded    ASSESSMENT AND PLAN:  Diet: Ashley [name] is currently in the action stage of change. As such, her goal is to continue with weight loss efforts. She has agreed to practicing portion control and making smarter food choices, such as increasing vegetables and decreasing simple carbohydrates.  Exercise: Ashley [name] has been instructed that some exercise is better than none for weight loss and overall health benefits.   Behavior Modification:  We discussed the following Behavioral Modification Strategies today: increasing lean protein intake, increase H2O intake, no skipping meals, better snacking choices, avoiding temptations, and planning for success. We discussed various medication options to help Ashley [name] with her weight loss efforts and we both agreed to continue current medications of phentermine  and topiramate  at same dose.  No follow-ups on file.Ashley [name] She was informed of the importance of  frequent follow up visits to maximize her success with intensive lifestyle modifications for her multiple health conditions.  Attestation Statements:   Reviewed by clinician on day of visit: allergies, medications, problem list, medical history, surgical history, family history, social history, and previous encounter notes.    Ashley [name] Cho, MD

## 2023-08-15 LAB — COMPREHENSIVE METABOLIC PANEL
ALT: 11 [IU]/L (ref 0–32)
AST: 16 [IU]/L (ref 0–40)
Albumin: 4.1 g/dL (ref 3.9–4.9)
Alkaline Phosphatase: 89 [IU]/L (ref 44–121)
BUN/Creatinine Ratio: 16 (ref 9–23)
BUN: 15 mg/dL (ref 6–24)
Bilirubin Total: 0.4 mg/dL (ref 0.0–1.2)
CO2: 20 mmol/L (ref 20–29)
Calcium: 8.7 mg/dL (ref 8.7–10.2)
Chloride: 107 mmol/L — ABNORMAL HIGH (ref 96–106)
Creatinine, Ser: 0.92 mg/dL (ref 0.57–1.00)
Globulin, Total: 2.4 g/dL (ref 1.5–4.5)
Glucose: 74 mg/dL (ref 70–99)
Potassium: 4.3 mmol/L (ref 3.5–5.2)
Sodium: 138 mmol/L (ref 134–144)
Total Protein: 6.5 g/dL (ref 6.0–8.5)
eGFR: 78 mL/min/{1.73_m2} (ref >59)

## 2023-08-15 LAB — VITAMIN D 25 HYDROXY (VIT D DEFICIENCY, FRACTURES): Vit D, 25-Hydroxy: 36.1 ng/mL (ref 30.0–100.0)

## 2023-08-15 LAB — LIPID PANEL WITH LDL/HDL RATIO
Cholesterol, Total: 270 mg/dL — ABNORMAL HIGH (ref 100–199)
HDL: 90 mg/dL (ref >39)
LDL Chol Calc (NIH): 172 mg/dL — ABNORMAL HIGH (ref 0–99)
LDL/HDL Ratio: 1.9 {ratio} (ref 0.0–3.2)
Triglycerides: 56 mg/dL (ref 0–149)
VLDL Cholesterol Cal: 8 mg/dL (ref 5–40)

## 2023-08-15 LAB — HEMOGLOBIN A1C
Est. average glucose Bld gHb Est-mCnc: 105 mg/dL
Hgb A1c MFr Bld: 5.3 % (ref 4.8–5.6)

## 2023-08-15 LAB — INSULIN, RANDOM: INSULIN: 7 u[IU]/mL (ref 2.6–24.9)

## 2023-08-22 DIAGNOSIS — E7849 Other hyperlipidemia: Secondary | ICD-10-CM | POA: Insufficient documentation

## 2023-08-22 MED ORDER — PHENTERMINE HCL 15 MG PO CAPS: 15.0000 mg | ORAL_CAPSULE | Freq: Every day | ORAL | 0 refills | Status: DC

## 2023-08-22 NOTE — Assessment & Plan Note (Signed)
 Patient started first treatment dose of makeshift qsymia at 175lbs.  She is at 165lbs at this appointment; has achieved more than 5% loss.  She is stable on current medication and is not experiencing any side effects.  Needs refills of her phentermine  and topiramate  today.  PDMP checked and no concerns noted.

## 2023-08-22 NOTE — Assessment & Plan Note (Signed)
 Last A1c in epic of 5.5 with barely elevated insulin .  Patient is working on Environmental manager and focusing on protein intake and achieving protein goal.Will repeat labs of CMP, A1c and Insulin  level today.  Will plan to discuss labs at next appointment.

## 2023-08-22 NOTE — Assessment & Plan Note (Signed)
 Last labs done showing elevated LDL.  We had previously discussed importance of controlling cholesterol through dietary changes.  Will repeat fasting lipid panel today and discuss results at next appointment.

## 2023-08-22 NOTE — Assessment & Plan Note (Signed)
 Patient doing well on RX vitamin d .  Vitamin D  level ordered today.  Will discuss lab result a next appointment.

## 2023-09-11 ENCOUNTER — Telehealth (INDEPENDENT_AMBULATORY_CARE_PROVIDER_SITE_OTHER): Payer: 59 | Admitting: Family Medicine

## 2023-09-11 ENCOUNTER — Encounter (INDEPENDENT_AMBULATORY_CARE_PROVIDER_SITE_OTHER): Payer: Self-pay | Admitting: Family Medicine

## 2023-09-11 VITALS — Ht 64.0 in

## 2023-09-11 DIAGNOSIS — R7303 Prediabetes: Secondary | ICD-10-CM

## 2023-09-11 DIAGNOSIS — E559 Vitamin D deficiency, unspecified: Secondary | ICD-10-CM | POA: Diagnosis not present

## 2023-09-11 DIAGNOSIS — E7849 Other hyperlipidemia: Secondary | ICD-10-CM

## 2023-09-11 DIAGNOSIS — E669 Obesity, unspecified: Secondary | ICD-10-CM | POA: Diagnosis not present

## 2023-09-11 DIAGNOSIS — E66811 Obesity, class 1: Secondary | ICD-10-CM

## 2023-09-11 DIAGNOSIS — Z6828 Body mass index (BMI) 28.0-28.9, adult: Secondary | ICD-10-CM

## 2023-09-11 MED ORDER — VITAMIN D (ERGOCALCIFEROL) 1.25 MG (50000 UNIT) PO CAPS: 50000.0000 [IU] | ORAL_CAPSULE | ORAL | 0 refills | Status: DC

## 2023-09-11 NOTE — Progress Notes (Signed)
 TeleHealth Visit:  Due to the COVID-19 pandemic, this visit was completed with telemedicine (audio/video) technology to reduce patient and provider exposure as well as to preserve personal protective equipment.   Jasline has verbally consented to this TeleHealth visit. The patient is located at home, the provider is located at the [facility] office. The participants in this visit include the listed provider and patient.  Chief Complaint: OBESITY Niley is here to discuss her progress with her obesity treatment plan along with follow-up of her obesity related diagnoses. Legaci is on practicing portion control and making smarter food choices, such as increasing vegetables and decreasing simple carbohydrates and states she is following her eating plan approximately 95% of the time. Shanell states she is exercising 90 minutes 1 time per week and walking 20 minutes 7 times per week..  Today's visit was #: 33 Starting weight: 176 lb Starting date: 04/13/21  Interim History: Patient is having a MyChart visit today as she has had food poisoning for the last 4 days.  States that she also had influenza when she got back from her trip to [place] recently.  She mentions she saw her cholesterol panel with an elevated cholesterol level and has decided she is going to decrease her intake of fried foods and is using her air [place] only.  She is also going to limit her intake of pork to breakfast only.  Subjective:   There are no diagnoses linked to this encounter. Assessment/Plan:   Problem List Items Addressed This Visit       Other   Prediabetes   A1c and insulin  continues to stay controlled.  We discussed labs from prior appointment.  Patient has been mindful he controlling her intake of simple carbohydrate.  Will need to repeat labs in 4 to 6 months to reassess levels.      Vitamin D  deficiency   Level still significantly low despite prescription strength supplementation.   Patient reports that she has not been as consistent as she could be with her intake of the supplement.  Will refill weekly prescription strength vitamin D  and repeat labs in 3 to 4 months.  If she has been consistent at that time and vitamin D  level still has not increased we will change over to D2 formulation.      Relevant Medications   Vitamin D , Ergocalciferol , (DRISDOL ) 1.25 MG (50000 UNIT) CAPS capsule   Other hyperlipidemia - Primary   The 10-year ASCVD risk score (Arnett DK, et al., 2019) is: 0.3%   Values used to calculate the score:     Age: 48 years     Sex: Female     Is Non-Hispanic African American: Yes     Diabetic: No     Tobacco smoker: No     Systolic Blood Pressure: 104 mmHg     Is BP treated: No     HDL Cholesterol: 90 mg/dL     Total Cholesterol: 270 mg/dL  Recent cholesterol panel done at the beginning of January of this year showing an LDL of 172 which is significantly increased from 154 from February of last year.  HDL stays elevated at 90, and triglycerides controlled at 56.  I discussed lab results with patient as well as likelihood of needing medication to help with cholesterol management.  10-year ASCVD risk below.  Patient would like to make mindful food changes and intake changes for the next 3 months prior to starting medication.  There are no diagnoses linked to this encounter. Lesle is currently in the action stage of change. As such, her goal is to continue with weight loss efforts. She has agreed to practicing portion control and making smarter food choices, such as increasing vegetables and decreasing simple carbohydrates.   Exercise goals: For substantial health benefits, adults should do at least 150 minutes (2 hours and 30 minutes) a week of moderate-intensity, or 75 minutes (1 hour and 15 minutes) a week of vigorous-intensity aerobic physical activity, or an equivalent combination of moderate- and vigorous-intensity aerobic activity. Aerobic  activity should be performed in episodes of at least 10 minutes, and preferably, it should be spread throughout the week.  Behavioral modification strategies: increasing lean protein intake, decreasing simple carbohydrates, better snacking choices, and planning for success.  Onetta has agreed to follow-up with our clinic in 4 weeks. She was informed of the importance of frequent follow-up visits to maximize her success with intensive lifestyle modifications for her multiple health conditions.   Objective:   VITALS: Per patient if applicable, see vitals. GENERAL: Alert and in no acute distress. CARDIOPULMONARY: No increased WOB. Speaking in clear sentences.  PSYCH: Pleasant and cooperative. Speech normal rate and rhythm. Affect is appropriate. Insight and judgement are appropriate. Attention is focused, linear, and appropriate.  NEURO: Oriented as arrived to appointment on time with no prompting.   Lab Results  Component Value Date   CREATININE 0.92 08/14/2023   BUN 15 08/14/2023   NA 138 08/14/2023   K 4.3 08/14/2023   CL 107 (H) 08/14/2023   CO2 20 08/14/2023   Lab Results  Component Value Date   ALT 11 08/14/2023   AST 16 08/14/2023   ALKPHOS 89 08/14/2023   BILITOT 0.4 08/14/2023   Lab Results  Component Value Date   HGBA1C 5.3 08/14/2023   HGBA1C 5.5 09/19/2022   HGBA1C 5.3 01/05/2022   HGBA1C 5.3 07/14/2021   HGBA1C 5.8 (H) 03/17/2015   Lab Results  Component Value Date   INSULIN  7.0 08/14/2023   INSULIN  6.4 09/19/2022   INSULIN  7.0 01/05/2022   INSULIN  4.8 07/14/2021   Lab Results  Component Value Date   TSH 1.918 03/17/2015   Lab Results  Component Value Date   CHOL 270 (H) 08/14/2023   HDL 90 08/14/2023   LDLCALC 172 (H) 08/14/2023   TRIG 56 08/14/2023   Lab Results  Component Value Date   VD25OH 36.1 08/14/2023   VD25OH 37.1 09/19/2022   VD25OH 47.8 01/05/2022   Lab Results  Component Value Date   WBC 13.2 (H) 11/08/2016   HGB 12.3  11/08/2016   HCT 37.3 11/08/2016   MCV 83.4 11/08/2016   PLT 281 11/08/2016   No results found for: IRON, TIBC, FERRITIN  Attestation Statements:   Reviewed by clinician on day of visit: allergies, medications, problem list, medical history, surgical history, family history, social history, and previous encounter notes.   [name], MD

## 2023-09-11 NOTE — Assessment & Plan Note (Addendum)
 The 10-year ASCVD risk score (Arnett DK, et al., 2019) is: 0.3%   Values used to calculate the score:     Age: 48 years     Sex: Female     Is Non-Hispanic African American: Yes     Diabetic: No     Tobacco smoker: No     Systolic Blood Pressure: 104 mmHg     Is BP treated: No     HDL Cholesterol: 90 mg/dL     Total Cholesterol: 270 mg/dL  Recent cholesterol panel done at the beginning of January of this year showing an LDL of 172 which is significantly increased from 154 from February of last year.  HDL stays elevated at 90, and triglycerides controlled at 56.  I discussed lab results with patient as well as likelihood of needing medication to help with cholesterol management.  10-year ASCVD risk below.  Patient would like to make mindful food changes and intake changes for the next 3 months prior to starting medication.

## 2023-09-18 NOTE — Assessment & Plan Note (Signed)
A1c and insulin continues to stay controlled.  We discussed labs from prior appointment.  Patient has been mindful he controlling her intake of simple carbohydrate.  Will need to repeat labs in 4 to 6 months to reassess levels.

## 2023-09-18 NOTE — Assessment & Plan Note (Signed)
Level still significantly low despite prescription strength supplementation.  Patient reports that she has not been as consistent as she could be with her intake of the supplement.  Will refill weekly prescription strength vitamin D and repeat labs in 3 to 4 months.  If she has been consistent at that time and vitamin D level still has not increased we will change over to D2 formulation.

## 2023-10-09 ENCOUNTER — Ambulatory Visit (INDEPENDENT_AMBULATORY_CARE_PROVIDER_SITE_OTHER): Payer: 59 | Admitting: Family Medicine

## 2023-10-09 VITALS — BP 96/65 | HR 87 | Temp 98.2°F | Ht 64.0 in | Wt 164.0 lb

## 2023-10-09 DIAGNOSIS — R632 Polyphagia: Secondary | ICD-10-CM | POA: Diagnosis not present

## 2023-10-09 DIAGNOSIS — Z6830 Body mass index (BMI) 30.0-30.9, adult: Secondary | ICD-10-CM

## 2023-10-09 DIAGNOSIS — E669 Obesity, unspecified: Secondary | ICD-10-CM

## 2023-10-09 DIAGNOSIS — E66811 Obesity, class 1: Secondary | ICD-10-CM

## 2023-10-09 DIAGNOSIS — E7849 Other hyperlipidemia: Secondary | ICD-10-CM

## 2023-10-09 DIAGNOSIS — E559 Vitamin D deficiency, unspecified: Secondary | ICD-10-CM | POA: Diagnosis not present

## 2023-10-09 DIAGNOSIS — Z6828 Body mass index (BMI) 28.0-28.9, adult: Secondary | ICD-10-CM

## 2023-10-09 MED ORDER — PHENTERMINE HCL 15 MG PO CAPS: 15.0000 mg | ORAL_CAPSULE | Freq: Every day | ORAL | 0 refills | Status: DC

## 2023-10-09 MED ORDER — VITAMIN D (ERGOCALCIFEROL) 1.25 MG (50000 UNIT) PO CAPS: 50000.0000 [IU] | ORAL_CAPSULE | ORAL | 0 refills | Status: DC

## 2023-10-09 MED ORDER — TOPIRAMATE 100 MG PO TABS: 100.0000 mg | ORAL_TABLET | Freq: Every day | ORAL | 0 refills | Status: DC

## 2023-10-09 NOTE — Progress Notes (Signed)
 SUBJECTIVE:  Chief Complaint: Obesity  Interim History: Patient had a gender reveal this past weekend for her son's baby (it is going to be a boy).  She feels overwhelmed with all the political news.  She mentions she read into crestor and is wondering about long term effects of this. Her eating has picked up and she has started exercising more.  She is doing 1 mile walking video in the am 2x a week.  She started exercising at church on Tuesdays.   Shayna is here to discuss her progress with her obesity treatment plan. She is on the practicing portion control and making smarter food choices, such as increasing vegetables and decreasing simple carbohydrates and journaling and states she is following her eating plan approximately 100 % of the time. She states she is exercising 25 minutes 7 times per week.   OBJECTIVE: Visit Diagnoses: Problem List Items Addressed This Visit       Other   Polyphagia   Patient started first treatment dose of makeshift qsymia at 175lbs.  Has lost more than the 5% of goal loss- at 164lbs at this appointment.  Needs refills of her phentermine and topiramate today.  PDMP checked and no concerns noted.  Will send in 2 months of prescription and follow up in 8 weeks.      Relevant Medications   topiramate (TOPAMAX) 100 MG tablet   phentermine 15 MG capsule   Vitamin D deficiency   Patient Vitamin D level below goal and needs prescription strength replacement.  2 month prescription sent to pharmacy.  Fatigue somewhat improving with supplementation and implementation of physical activity.      Relevant Medications   Vitamin D, Ergocalciferol, (DRISDOL) 1.25 MG (50000 UNIT) CAPS capsule   Other hyperlipidemia - Primary   Discussed lab results and statin.  She is considering black seed oil for cholesterol.  She is worried about side effects.  We discussed red yeast rice as an option that is all natural but that with her cholesterol level she will likely need  pharmacologic management at some point.  Will repeat cholesterol in May.       Vitals Temp: 98.2 F (36.8 C) BP: 96/65 Pulse Rate: 87 SpO2: 100 %   Anthropometric Measurements Height: 5\' 4"  (1.626 m) Weight: 164 lb (74.4 kg) BMI (Calculated): 28.14 Weight at Last Visit: 165 lb Weight Lost Since Last Visit: 1 Weight Gained Since Last Visit: 0 Starting Weight: 176 lb Total Weight Loss (lbs): 12 lb (5.443 kg)   Body Composition  Body Fat %: 35.6 % Fat Mass (lbs): 58.6 lbs Muscle Mass (lbs): 100.6 lbs Total Body Water (lbs): 67.4 lbs Visceral Fat Rating : 7   Other Clinical Data Today's Visit #: 74 Starting Date: 04/13/21 Comments: PC/[place]     ASSESSMENT AND PLAN:  Diet: Dajane is currently in the action stage of change. As such, her goal is to continue with weight loss efforts and has agreed to practicing portion control and making smarter food choices, such as increasing vegetables and decreasing simple carbohydrates.   Exercise:  For substantial health benefits, adults should do at least 150 minutes (2 hours and 30 minutes) a week of moderate-intensity, or 75 minutes (1 hour and 15 minutes) a week of vigorous-intensity aerobic physical activity, or an equivalent combination of moderate- and vigorous-intensity aerobic activity. Aerobic activity should be performed in episodes of at least 10 minutes, and preferably, it should be spread throughout the week.  Behavior Modification:  We  discussed the following Behavioral Modification Strategies today: increasing lean protein intake, increasing vegetables, meal planning and cooking strategies, better snacking choices, and avoiding temptations. We discussed various medication options to help Kavina with her weight loss efforts and we both agreed to continue current medication of phentermine and topiramate at current dose.  No follow-ups on file.Marland Kitchen She was informed of the importance of frequent follow up visits to maximize  her success with intensive lifestyle modifications for her multiple health conditions.  Attestation Statements:   Reviewed by clinician on day of visit: allergies, medications, problem list, medical history, surgical history, family history, social history, and previous encounter notes.   Reuben Likes, MD

## 2023-10-09 NOTE — Assessment & Plan Note (Signed)
 Patient Vitamin D level below goal and needs prescription strength replacement.  2 month prescription sent to pharmacy.  Fatigue somewhat improving with supplementation and implementation of physical activity.

## 2023-10-09 NOTE — Assessment & Plan Note (Signed)
 Discussed lab results and statin.  She is considering black seed oil for cholesterol.  She is worried about side effects.  We discussed red yeast rice as an option that is all natural but that with her cholesterol level she will likely need pharmacologic management at some point.  Will repeat cholesterol in May.

## 2023-10-09 NOTE — Assessment & Plan Note (Signed)
 Patient started first treatment dose of makeshift qsymia at 175lbs.  Has lost more than the 5% of goal loss- at 164lbs at this appointment.  Needs refills of her phentermine and topiramate today.  PDMP checked and no concerns noted.  Will send in 2 months of prescription and follow up in 8 weeks.

## 2023-11-06 ENCOUNTER — Ambulatory Visit (INDEPENDENT_AMBULATORY_CARE_PROVIDER_SITE_OTHER): Payer: 59 | Admitting: Family Medicine

## 2023-11-07 ENCOUNTER — Ambulatory Visit (INDEPENDENT_AMBULATORY_CARE_PROVIDER_SITE_OTHER): Payer: 59 | Admitting: Family Medicine

## 2023-12-04 ENCOUNTER — Ambulatory Visit (INDEPENDENT_AMBULATORY_CARE_PROVIDER_SITE_OTHER): Admitting: Family Medicine

## 2023-12-04 ENCOUNTER — Encounter (INDEPENDENT_AMBULATORY_CARE_PROVIDER_SITE_OTHER): Payer: Self-pay

## 2023-12-11 ENCOUNTER — Encounter (INDEPENDENT_AMBULATORY_CARE_PROVIDER_SITE_OTHER): Payer: Self-pay | Admitting: Family Medicine

## 2023-12-11 ENCOUNTER — Ambulatory Visit (INDEPENDENT_AMBULATORY_CARE_PROVIDER_SITE_OTHER): Admitting: Family Medicine

## 2023-12-11 VITALS — BP 104/67 | HR 82 | Temp 98.4°F | Ht 64.0 in | Wt 169.0 lb

## 2023-12-11 DIAGNOSIS — E669 Obesity, unspecified: Secondary | ICD-10-CM

## 2023-12-11 DIAGNOSIS — R7303 Prediabetes: Secondary | ICD-10-CM | POA: Diagnosis not present

## 2023-12-11 DIAGNOSIS — E559 Vitamin D deficiency, unspecified: Secondary | ICD-10-CM

## 2023-12-11 DIAGNOSIS — E7849 Other hyperlipidemia: Secondary | ICD-10-CM

## 2023-12-11 DIAGNOSIS — R632 Polyphagia: Secondary | ICD-10-CM | POA: Diagnosis not present

## 2023-12-11 DIAGNOSIS — E66811 Obesity, class 1: Secondary | ICD-10-CM

## 2023-12-11 DIAGNOSIS — Z6829 Body mass index (BMI) 29.0-29.9, adult: Secondary | ICD-10-CM

## 2023-12-11 MED ORDER — TOPIRAMATE 100 MG PO TABS: 100.0000 mg | ORAL_TABLET | Freq: Every day | ORAL | 0 refills | Status: DC

## 2023-12-11 MED ORDER — PHENTERMINE HCL 15 MG PO CAPS
15.0000 mg | ORAL_CAPSULE | Freq: Every day | ORAL | 0 refills | Status: DC
Start: 2023-12-11 — End: 2023-12-11

## 2023-12-11 MED ORDER — PHENTERMINE HCL 15 MG PO CAPS: 15.0000 mg | ORAL_CAPSULE | Freq: Every day | ORAL | 0 refills | Status: DC

## 2023-12-11 MED ORDER — VITAMIN D (ERGOCALCIFEROL) 1.25 MG (50000 UNIT) PO CAPS: 50000.0000 [IU] | ORAL_CAPSULE | ORAL | 0 refills | Status: DC

## 2023-12-11 NOTE — Assessment & Plan Note (Signed)
 On RX Vitamin D  and no nausea, vomiting or muscle weakness.  Needs a repeat Vitamin D  level today.

## 2023-12-11 NOTE — Assessment & Plan Note (Signed)
 Patient on combination phentermine  and topiramate .  She is at max dose.Patient started first treatment dose of makeshift qsymia at 175lbs.  With recent gain is down 6lbs.  Will refill this month and then reevaluate if medication is truly working for her anymore.  Needs refills of her phentermine  and topiramate  today.  PDMP checked and no concerns noted.  Follow up in 6 weeks

## 2023-12-11 NOTE — Progress Notes (Signed)
 SUBJECTIVE:  Chief Complaint: Obesity  Interim History: Patient here for follow up.  She has gained 5lbs and has been eating more fruits and vegetables.  She has been taken more food in overall.  She is wondering about whether perimenopause is making a difference.  She is still working on getting enough protein in.  She is putting more  food on her plate but isn't finishing it.  She does occasionally log her food- she mentions once [employer] did tell her she met her protein intake.  Ashley [name] is here to discuss her progress with her obesity treatment plan. She is on the practicing portion control and making smarter food choices, such as increasing vegetables and decreasing simple carbohydrates and states she is following her eating plan approximately 85 % of the time. She states she is exercising and walking 15-60 minutes 3 times per week.   OBJECTIVE: Visit Diagnoses: Problem List Items Addressed This Visit       Other   Prediabetes   Last A1c better controlled at 5.3.  Has been increasing total carb intake in the form of fruits and vegetables. Will order CMP, A1c and Insulin  level today.      Relevant Orders   Comprehensive metabolic panel with GFR   Hemoglobin A1c   Insulin , random   Obesity   Relevant Medications   phentermine  15 MG capsule   Polyphagia - Primary   Patient on combination phentermine  and topiramate .  She is at max dose.Patient started first treatment dose of makeshift qsymia at 175lbs.  With recent gain is down 6lbs.  Will refill this month and then reevaluate if medication is truly working for her anymore.  Needs refills of her phentermine  and topiramate  today.  PDMP checked and no concerns noted.  Follow up in 6 weeks      Relevant Medications   topiramate  (TOPAMAX ) 100 MG tablet   phentermine  15 MG capsule   Vitamin D  deficiency   On RX Vitamin D  and no nausea, vomiting or muscle weakness.  Needs a repeat Vitamin D  level today.      Relevant  Medications   Vitamin D , Ergocalciferol , (DRISDOL ) 1.25 MG (50000 UNIT) CAPS capsule   Other Relevant Orders   VITAMIN D  25 Hydroxy (Vit-D Deficiency, Fractures)   Other hyperlipidemia   Last LDL elevated in the 170s.  Needs a repeat FLP today and plan to discuss labs at next appointment.      Relevant Orders   Lipid Panel With LDL/HDL Ratio   Other Visit Diagnoses       BMI 29.0-29.9,adult           Vitals Temp: 98.4 F (36.9 C) BP: 104/67 Pulse Rate: 82 SpO2: 100 %   Anthropometric Measurements Height: 5\' 4"  (1.626 m) Weight: 169 lb (76.7 kg) BMI (Calculated): 28.99 Weight at Last Visit: 164 lb Weight Lost Since Last Visit: 0 Weight Gained Since Last Visit: 5 Starting Weight: 176 lb Total Weight Loss (lbs): 7 lb (3.175 kg)   Body Composition  Body Fat %: 37 % Fat Mass (lbs): 62.6 lbs Muscle Mass (lbs): 101 lbs Total Body Water  (lbs): 68.8 lbs Visceral Fat Rating : 8   Other Clinical Data Fasting: yes Labs: yes Today's Visit #: 34 Starting Date: 04/13/21 Comments: PC/[place]     ASSESSMENT AND PLAN:  Diet: Ashley [name] is currently in the action stage of change. As such, her goal is to continue with weight loss efforts and has agreed to keeping a food  journal and adhering to recommended goals of 1100-1200 calories and 85 or more grams of protein daily. Patient to start food log or journaling meal plan.  The initial goal will be to habitually log or journal for at least 4 days a week.  The expectation it that patient may not initially meet calorie or protein goals as the nturitional understanding of food intake is begun.  We discussed the 10:1 ratio when reading a food label.  Patient agrees to keep a food log either electronically or on paper and bring to the next appointment to be able to dissect and discuss it with provider.    Exercise:  For substantial health benefits, adults should do at least 150 minutes (2 hours and 30 minutes) a week of  moderate-intensity, or 75 minutes (1 hour and 15 minutes) a week of vigorous-intensity aerobic physical activity, or an equivalent combination of moderate- and vigorous-intensity aerobic activity. Aerobic activity should be performed in episodes of at least 10 minutes, and preferably, it should be spread throughout the week.  Behavior Modification:  We discussed the following Behavioral Modification Strategies today: increasing lean protein intake, decreasing simple carbohydrates, increasing vegetables, meal planning and cooking strategies, keeping healthy foods in the home, avoiding temptations, and planning for success. We discussed various medication options to help Seriyah with her weight loss efforts and we both agreed to continue phentermine  and topiramate  at current dose.  Return in about 6 weeks (around 01/22/2024).   She was informed of the importance of frequent follow up visits to maximize her success with intensive lifestyle modifications for her multiple health conditions.  Attestation Statements:   Reviewed by clinician on day of visit: allergies, medications, problem list, medical history, surgical history, family history, social history, and previous encounter notes.     Donaciano Frizzle, MD

## 2023-12-11 NOTE — Assessment & Plan Note (Signed)
 Last A1c better controlled at 5.3.  Has been increasing total carb intake in the form of fruits and vegetables. Will order CMP, A1c and Insulin  level today.

## 2023-12-11 NOTE — Assessment & Plan Note (Signed)
 Last LDL elevated in the 170s.  Needs a repeat FLP today and plan to discuss labs at next appointment.

## 2023-12-12 LAB — COMPREHENSIVE METABOLIC PANEL WITH GFR
ALT: 15 IU/L (ref 0–32)
AST: 19 IU/L (ref 0–40)
Albumin: 4.1 g/dL (ref 3.9–4.9)
Alkaline Phosphatase: 104 IU/L (ref 44–121)
BUN/Creatinine Ratio: 18 (ref 9–23)
BUN: 16 mg/dL (ref 6–24)
Bilirubin Total: 0.3 mg/dL (ref 0.0–1.2)
CO2: 17 mmol/L — ABNORMAL LOW (ref 20–29)
Calcium: 8.8 mg/dL (ref 8.7–10.2)
Chloride: 107 mmol/L — ABNORMAL HIGH (ref 96–106)
Creatinine, Ser: 0.88 mg/dL (ref 0.57–1.00)
Globulin, Total: 2.5 g/dL (ref 1.5–4.5)
Glucose: 75 mg/dL (ref 70–99)
Potassium: 4.5 mmol/L (ref 3.5–5.2)
Sodium: 138 mmol/L (ref 134–144)
Total Protein: 6.6 g/dL (ref 6.0–8.5)
eGFR: 82 mL/min/{1.73_m2} (ref >59)

## 2023-12-12 LAB — INSULIN, RANDOM: INSULIN: 7.7 u[IU]/mL (ref 2.6–24.9)

## 2023-12-12 LAB — LIPID PANEL WITH LDL/HDL RATIO
Cholesterol, Total: 267 mg/dL — ABNORMAL HIGH (ref 100–199)
HDL: 86 mg/dL (ref >39)
LDL Chol Calc (NIH): 170 mg/dL — ABNORMAL HIGH (ref 0–99)
LDL/HDL Ratio: 2 ratio (ref 0.0–3.2)
Triglycerides: 69 mg/dL (ref 0–149)
VLDL Cholesterol Cal: 11 mg/dL (ref 5–40)

## 2023-12-12 LAB — HEMOGLOBIN A1C
Est. average glucose Bld gHb Est-mCnc: 111 mg/dL
Hgb A1c MFr Bld: 5.5 % (ref 4.8–5.6)

## 2023-12-12 LAB — VITAMIN D 25 HYDROXY (VIT D DEFICIENCY, FRACTURES): Vit D, 25-Hydroxy: 40.6 ng/mL (ref 30.0–100.0)

## 2024-01-28 ENCOUNTER — Ambulatory Visit (INDEPENDENT_AMBULATORY_CARE_PROVIDER_SITE_OTHER): Admitting: Family Medicine

## 2024-01-28 ENCOUNTER — Encounter (INDEPENDENT_AMBULATORY_CARE_PROVIDER_SITE_OTHER): Payer: Self-pay | Admitting: Family Medicine

## 2024-01-28 VITALS — BP 96/63 | HR 71 | Temp 98.6°F | Ht 64.0 in | Wt 174.0 lb

## 2024-01-28 DIAGNOSIS — E669 Obesity, unspecified: Secondary | ICD-10-CM | POA: Diagnosis not present

## 2024-01-28 DIAGNOSIS — Z6830 Body mass index (BMI) 30.0-30.9, adult: Secondary | ICD-10-CM

## 2024-01-28 DIAGNOSIS — R632 Polyphagia: Secondary | ICD-10-CM

## 2024-01-28 DIAGNOSIS — Z6829 Body mass index (BMI) 29.0-29.9, adult: Secondary | ICD-10-CM

## 2024-01-28 MED ORDER — PHENTERMINE HCL 15 MG PO CAPS: 15.0000 mg | ORAL_CAPSULE | Freq: Every day | ORAL | 0 refills | Status: DC

## 2024-01-28 MED ORDER — TOPIRAMATE 100 MG PO TABS: 100.0000 mg | ORAL_TABLET | Freq: Every day | ORAL | 0 refills | Status: DC

## 2024-01-28 NOTE — Progress Notes (Signed)
 SUBJECTIVE:  Chief Complaint: Obesity  Interim History: Patient is incorporating more portion control and monitoring simple carbohydrate intake over the lasts 6 weeks.  She did celebrate her birthday last week and mentions she was eating out more frequently.  She mentions she going to [place] next month and is planning to eat at sandwich shops more likely than eating out downtown.  Thinks she has cut down on rice and bread but thinks perhaps bread quantity still has room for improvement.  Ashley [name] is here to discuss her progress with her obesity treatment plan. She is on the practicing portion control and making smarter food choices, such as increasing vegetables and decreasing simple carbohydrates and states she is following her eating plan approximately 50 % of the time. She states she is exercising working out and walking 15 minutes 7 times per week.   OBJECTIVE: Visit Diagnoses: Problem List Items Addressed This Visit       Other   Obesity   Anthropometric Measurements Height: 5' 4 (1.626 m) Weight: 174 lb (78.9 kg) BMI (Calculated): 29.85 Weight at Last Visit: 169 lb Weight Lost Since Last Visit: 0 Weight Gained Since Last Visit: 5 lb Starting Weight: 176 lb Total Weight Loss (lbs): 2 lb (0.907 kg) Peak Weight: 250 lb Body Composition  Body Fat %: 38.7 % Fat Mass (lbs): 67.4 lbs Muscle Mass (lbs): 101.6 lbs Total Body Water  (lbs): 73 lbs Visceral Fat Rating : 8 Other Clinical Data Fasting: yes Labs: no Today's Visit #: 35 Starting Date: 04/13/21 Comments: pc/[place]       Relevant Medications   phentermine  15 MG capsule   Polyphagia - Primary   Patient on combination phentermine  and topiramate .  She is at max dose.Patient started first treatment dose of makeshift qsymia at 175lbs.   Will refill this month and then reevaluate if medication is truly working for her anymore.  Needs refills of her phentermine  and topiramate  today.  PDMP checked and no concerns noted.   Follow up in 4 weeks- will likely have to titrate off medication at that time      Relevant Medications   phentermine  15 MG capsule   topiramate  (TOPAMAX ) 100 MG tablet   Other Visit Diagnoses       BMI 29.0-29.9,adult           No data recorded       01/28/2024    7:00 AM 12/11/2023    7:00 AM 10/09/2023    7:00 AM  Vitals with BMI  Height 5' 4 5' 4 5' 4  Weight 174 lbs 169 lbs 164 lbs  BMI 29.85 28.99 28.14  Systolic 96 104 96  Diastolic 63 67 65  Pulse 71 82 87      ASSESSMENT AND PLAN:  Diet: [name] is currently in the action stage of change. As such, her goal is to continue with weight loss efforts and has agreed to practicing portion control and making smarter food choices, such as increasing vegetables and decreasing simple carbohydrates.   Exercise:  For substantial health benefits, adults should do at least 150 minutes (2 hours and 30 minutes) a week of moderate-intensity, or 75 minutes (1 hour and 15 minutes) a week of vigorous-intensity aerobic physical activity, or an equivalent combination of moderate- and vigorous-intensity aerobic activity. Aerobic activity should be performed in episodes of at least 10 minutes, and preferably, it should be spread throughout the week.  Behavior Modification:  We discussed the following Behavioral Modification Strategies today: increasing lean  protein intake, decreasing simple carbohydrates, increasing vegetables, meal planning and cooking strategies, keeping healthy foods in the home, and celebration eating strategies. We discussed various medication options to help [name] with her weight loss efforts and we both agreed to continue phentermine  and topiramate  at current dose.  Return in about 4 weeks (around 02/25/2024).   She was informed of the importance of frequent follow up visits to maximize her success with intensive lifestyle modifications for her multiple health conditions.  Attestation Statements:   Reviewed  by clinician on day of visit: allergies, medications, problem list, medical history, surgical history, family history, social history, and previous encounter notes.    [name], MD

## 2024-02-09 NOTE — Assessment & Plan Note (Addendum)
 Patient on combination phentermine  and topiramate .  She is at max dose.Patient started first treatment dose of makeshift qsymia at 175lbs.   Will refill this month and then reevaluate if medication is truly working for her anymore.  Needs refills of her phentermine  and topiramate  today.  PDMP checked and no concerns noted.  Follow up in 4 weeks- will likely have to titrate off medication at that time

## 2024-02-09 NOTE — Assessment & Plan Note (Signed)
 Anthropometric Measurements Height: 5' 4 (1.626 m) Weight: 174 lb (78.9 kg) BMI (Calculated): 29.85 Weight at Last Visit: 169 lb Weight Lost Since Last Visit: 0 Weight Gained Since Last Visit: 5 lb Starting Weight: 176 lb Total Weight Loss (lbs): 2 lb (0.907 kg) Peak Weight: 250 lb Body Composition  Body Fat %: 38.7 % Fat Mass (lbs): 67.4 lbs Muscle Mass (lbs): 101.6 lbs Total Body Water  (lbs): 73 lbs Visceral Fat Rating : 8 Other Clinical Data Fasting: yes Labs: no Today's Visit #: 35 Starting Date: 04/13/21 Comments: pc/[place]

## 2024-03-05 ENCOUNTER — Ambulatory Visit (INDEPENDENT_AMBULATORY_CARE_PROVIDER_SITE_OTHER): Admitting: Family Medicine

## 2024-03-05 ENCOUNTER — Encounter (INDEPENDENT_AMBULATORY_CARE_PROVIDER_SITE_OTHER): Payer: Self-pay | Admitting: Family Medicine

## 2024-03-05 VITALS — BP 97/66 | HR 79 | Temp 99.1°F | Ht 64.0 in | Wt 178.0 lb

## 2024-03-05 DIAGNOSIS — R632 Polyphagia: Secondary | ICD-10-CM | POA: Diagnosis not present

## 2024-03-05 DIAGNOSIS — E669 Obesity, unspecified: Secondary | ICD-10-CM

## 2024-03-05 DIAGNOSIS — E66811 Obesity, class 1: Secondary | ICD-10-CM

## 2024-03-05 DIAGNOSIS — Z6830 Body mass index (BMI) 30.0-30.9, adult: Secondary | ICD-10-CM | POA: Diagnosis not present

## 2024-03-05 MED ORDER — NALTREXONE HCL 50 MG PO TABS: 25.0000 mg | ORAL_TABLET | Freq: Every day | ORAL | 0 refills | Status: DC

## 2024-03-05 NOTE — Assessment & Plan Note (Addendum)
 Patient ran out of medication prior to this appointment.  She started in 2023 at 175lb and is over that weight now.  She was able to achieve 5% previously but seems meds are no longer efficacious.  Will stop medications of phentermine  and topiramate  and start naltrexone .  Possible side effects of naltrexone  discussed with patient today.  She was informed that the most likely side effect would be nausea or GI upset.  She would still like to proceed with current plan of starting naltrexone  on half a tablet daily.

## 2024-03-05 NOTE — Progress Notes (Deleted)
 [facility]

## 2024-03-05 NOTE — Progress Notes (Signed)
 SUBJECTIVE:  Chief Complaint: Obesity  Interim History: Patient ran out of her medication and noticed she was snacking more and eating late.  She does have a work trip coming up to [place]  and mentions she realizes she needs to make changes to get snacks out of the house.  She is wanting to start skating again to get a full body workout.  She is still walking her dog and doing her stretches and exercises.  She is working out at [employer] 1-2 times a week.  She is trying to be mindful of food intake.  She realizes she is still taking in more sugar than she needs to.  Ashley [name] is here to discuss her progress with her obesity treatment plan. She is on the her own plan of watching what she eats, increasing protien and water  and states she is following her eating plan approximately 70 % of the time. She states she is exercising 10-45 minutes 5-7 times per week.   OBJECTIVE: Visit Diagnoses: Problem List Items Addressed This Visit       Other   Obesity   Polyphagia - Primary   Relevant Medications   naltrexone  (DEPADE) 50 MG tablet    Vitals Temp: 99.1 F (37.3 C) BP: 97/66 Pulse Rate: 79 SpO2: 98 %   Anthropometric Measurements Height: 5' 4 (1.626 m) Weight: 178 lb (80.7 kg) BMI (Calculated): 30.54 Weight at Last Visit: 174lb Weight Lost Since Last Visit: 0 Weight Gained Since Last Visit: 4lb Starting Weight: 176lb Total Weight Loss (lbs): 0 lb (0 kg) Peak Weight: 250lb   Body Composition  Body Fat %: 35 % Fat Mass (lbs): 62.4 lbs Muscle Mass (lbs): 110 lbs Total Body Water  (lbs): 73.8 lbs Visceral Fat Rating : 8   Other Clinical Data Fasting: no Labs: no Today's Visit #: 36 Starting Date: 04/13/21 Comments: PC/[place]     ASSESSMENT AND PLAN: Assessment & Plan Polyphagia Patient ran out of medication prior to this appointment.  She started in 2023 at 175lb and is over that weight now.  She was able to achieve 5% previously but seems meds are no longer  efficacious.  Will stop medications of phentermine  and topiramate  and start naltrexone .  Possible side effects of naltrexone  discussed with patient today.  She was informed that the most likely side effect would be nausea or GI upset.  She would still like to proceed with current plan of starting naltrexone  on half a tablet daily. Obesity with starting BMI of 30.2  BMI 30.0-30.9,adult    Diet: [name] is currently in the action stage of change. As such, her goal is to continue with weight loss efforts and has agreed to practicing portion control and making smarter food choices, such as increasing vegetables and decreasing simple carbohydrates.   Exercise:  For substantial health benefits, adults should do at least 150 minutes (2 hours and 30 minutes) a week of moderate-intensity, or 75 minutes (1 hour and 15 minutes) a week of vigorous-intensity aerobic physical activity, or an equivalent combination of moderate- and vigorous-intensity aerobic activity. Aerobic activity should be performed in episodes of at least 10 minutes, and preferably, it should be spread throughout the week.  Behavior Modification:  We discussed the following Behavioral Modification Strategies today: increasing lean protein intake, decreasing simple carbohydrates, increasing vegetables, better snacking choices, planning for success, and decrease junk food . We discussed various medication options to help [name] with her weight loss efforts and we both agreed to stop phentermine  and topiramat.  Return in about 4 weeks (around 04/02/2024).   She was informed of the importance of frequent follow up visits to maximize her success with intensive lifestyle modifications for her multiple health conditions.  Attestation Statements:   Reviewed by clinician on day of visit: allergies, medications, problem list, medical history, surgical history, family history, social history, and previous encounter notes.     [name],  MD

## 2024-04-02 ENCOUNTER — Encounter (INDEPENDENT_AMBULATORY_CARE_PROVIDER_SITE_OTHER): Payer: Self-pay | Admitting: Family Medicine

## 2024-04-02 ENCOUNTER — Ambulatory Visit (INDEPENDENT_AMBULATORY_CARE_PROVIDER_SITE_OTHER): Admitting: Family Medicine

## 2024-04-02 VITALS — BP 103/66 | HR 78 | Temp 98.5°F | Ht 64.0 in | Wt 182.0 lb

## 2024-04-02 DIAGNOSIS — R632 Polyphagia: Secondary | ICD-10-CM | POA: Diagnosis not present

## 2024-04-02 DIAGNOSIS — Z6831 Body mass index (BMI) 31.0-31.9, adult: Secondary | ICD-10-CM | POA: Diagnosis not present

## 2024-04-02 DIAGNOSIS — E669 Obesity, unspecified: Secondary | ICD-10-CM | POA: Diagnosis not present

## 2024-04-02 DIAGNOSIS — E66811 Obesity, class 1: Secondary | ICD-10-CM

## 2024-04-02 NOTE — Progress Notes (Signed)
 SUBJECTIVE:  Chief Complaint: Obesity  Interim History: When patient went to [place]  patient took all the snacks out of her house and mentions she is focusing on her protein.  She is cooking more and getting more protein and vegetables in consistently.  She didn't log consistently but did log once or twice in the month. She is going to her parents house for Labor Day weekend.  She has been visiting her grandchild daily and is really enjoying that.  She is working out.  Phyliss is here to discuss her progress with her obesity treatment plan. She is on the practicing portion control and making smarter food choices, such as increasing vegetables and decreasing simple carbohydrates and states she is following her eating plan approximately 85 % of the time. She states she is exercising 60 minutes 3 times per week and walking the dog.  She is going to the gym with her best friend and exercising at her church.   OBJECTIVE: Visit Diagnoses: Problem List Items Addressed This Visit   None   Vitals Temp: 98.5 F (36.9 C) BP: 103/66 Pulse Rate: 78 SpO2: 100 %   Anthropometric Measurements Height: 5' 4 (1.626 m) Weight: 182 lb (82.6 kg) BMI (Calculated): 31.22 Weight at Last Visit: 178 lb Weight Lost Since Last Visit: 0 Weight Gained Since Last Visit: 3 Starting Weight: 176 lb Total Weight Loss (lbs): 0 lb (0 kg)   Body Composition  Body Fat %: 39.2 % Fat Mass (lbs): 71.6 lbs Muscle Mass (lbs): 105.6 lbs Total Body Water  (lbs): 74.6 lbs Visceral Fat Rating : 9   Other Clinical Data Fasting: yes Labs: no Today's Visit #: 37 Starting Date: 04/13/21 Comments: PC/[place]     ASSESSMENT AND PLAN: Assessment & Plan Polyphagia Patient reports no significant change in food intake using naltrexone .  She previously was on combination phentermine  and topiramate  without any appreciable or significant weight loss.  These medications were tapered due to that.  Will stop naltrexone  as  patient is not experiencing any symptom control on that either. Obesity with starting BMI of 30.2  BMI 31.0-31.9,adult    Diet: Shawndell is currently in the action stage of change. As such, her goal is to continue with weight loss efforts and has agreed to keeping a food journal and adhering to recommended goals of 1200 calories and 90 or more grams of protein. Patient to start food log or journaling meal plan.  The initial goal will be to habitually log or journal for at least 3 days a week.  The expectation it that patient may not initially meet calorie or protein goals as the nturitional understanding of food intake is begun.  We discussed the 10:1 ratio when reading a food label.  Patient agrees to keep a food log either electronically or on paper and bring to the next appointment to be able to dissect and discuss it with provider.    Exercise:  For substantial health benefits, adults should do at least 150 minutes (2 hours and 30 minutes) a week of moderate-intensity, or 75 minutes (1 hour and 15 minutes) a week of vigorous-intensity aerobic physical activity, or an equivalent combination of moderate- and vigorous-intensity aerobic activity. Aerobic activity should be performed in episodes of at least 10 minutes, and preferably, it should be spread throughout the week.  Behavior Modification:  We discussed the following Behavioral Modification Strategies today: increasing lean protein intake, decreasing simple carbohydrates, increasing vegetables, meal planning and cooking strategies, keeping healthy foods in the  home, and planning for success. We discussed various medication options to help Onedia with her weight loss efforts and we both agreed to discontinue naltrexone  as it did not get help with cravings.  Return in about 4 weeks (around 04/30/2024).   She was informed of the importance of frequent follow up visits to maximize her success with intensive lifestyle modifications for her  multiple health conditions.  Attestation Statements:   Reviewed by clinician on day of visit: allergies, medications, problem list, medical history, surgical history, family history, social history, and previous encounter notes.     [name], MD

## 2024-04-09 NOTE — Assessment & Plan Note (Signed)
 Patient reports no significant change in food intake using naltrexone .  She previously was on combination phentermine  and topiramate  without any appreciable or significant weight loss.  These medications were tapered due to that.  Will stop naltrexone  as patient is not experiencing any symptom control on that either.

## 2024-05-01 ENCOUNTER — Other Ambulatory Visit (HOSPITAL_BASED_OUTPATIENT_CLINIC_OR_DEPARTMENT_OTHER): Payer: Self-pay | Admitting: Family Medicine

## 2024-05-01 ENCOUNTER — Encounter (INDEPENDENT_AMBULATORY_CARE_PROVIDER_SITE_OTHER): Admitting: Family Medicine

## 2024-05-01 DIAGNOSIS — E7800 Pure hypercholesterolemia, unspecified: Secondary | ICD-10-CM

## 2024-05-05 NOTE — Progress Notes (Signed)
 Appointment canceled

## 2024-05-08 ENCOUNTER — Ambulatory Visit (INDEPENDENT_AMBULATORY_CARE_PROVIDER_SITE_OTHER): Admitting: Family Medicine

## 2024-05-08 ENCOUNTER — Encounter (INDEPENDENT_AMBULATORY_CARE_PROVIDER_SITE_OTHER): Payer: Self-pay | Admitting: Family Medicine

## 2024-05-08 VITALS — BP 119/72 | HR 88 | Temp 98.7°F | Ht 64.0 in | Wt 187.0 lb

## 2024-05-08 DIAGNOSIS — E7849 Other hyperlipidemia: Secondary | ICD-10-CM | POA: Diagnosis not present

## 2024-05-08 DIAGNOSIS — Z6830 Body mass index (BMI) 30.0-30.9, adult: Secondary | ICD-10-CM

## 2024-05-08 DIAGNOSIS — E66811 Obesity, class 1: Secondary | ICD-10-CM | POA: Diagnosis not present

## 2024-05-08 DIAGNOSIS — Z6832 Body mass index (BMI) 32.0-32.9, adult: Secondary | ICD-10-CM | POA: Diagnosis not present

## 2024-05-08 NOTE — Progress Notes (Signed)
 SUBJECTIVE:  Chief Complaint: Obesity  Interim History: patient's father died unexpectedly since last appointment due to a heart attack.  She is in active grief.  She has a cardiac CT next month.  Recently saw PCP. No changes in treatments plans at that appointment. She has been eating but realizess he was more indulgent in her eating due to food being brought over by friends and family after her dad's death.  She started smoking again.  She is eating throughout the day.  Is going toh te [employer] but is planning to get back to cooking and focusing on protein and vegetables when she comes back.  She is going to visit her sister in [place]  next weekend.  Ashley [name] is here to discuss her progress with her obesity treatment plan. She is on the keeping a food journal and adhering to recommended goals of 1200 calories and 90 grams of protein and states she is following her eating plan approximately 25% of the time. She states she is not exercising, but is walking the dog.  OBJECTIVE: Visit Diagnoses: Problem List Items Addressed This Visit       Other   Obesity   Other hyperlipidemia - Primary   Other Visit Diagnoses       BMI 32.0-32.9,adult           Vitals Temp: 98.7 F (37.1 C) BP: 119/72 Pulse Rate: 88 SpO2: 100 %   Anthropometric Measurements Height: 5' 4 (1.626 m) Weight: 187 lb (84.8 kg) BMI (Calculated): 32.08 Weight at Last Visit: 182 lb Weight Lost Since Last Visit: 0 Weight Gained Since Last Visit: 5 Starting Weight: 176 lb Total Weight Loss (lbs): 0 lb (0 kg)   Body Composition  Body Fat %: 40.9 % Fat Mass (lbs): 76.4 lbs Muscle Mass (lbs): 105 lbs Total Body Water  (lbs): 76 lbs Visceral Fat Rating : 9   Other Clinical Data Today's Visit #: 10 Starting Date: 04/13/21 Comments: Pc/[place]     ASSESSMENT AND PLAN: Assessment & Plan Other hyperlipidemia Recent labs done at primary care shows still an elevated cholesterol that is slightly  improved into the 150s.  However upon risk stratifying patient is still lower risk than anticipated given her elevated HDL and lack of other comorbidities.  Patient was encouraged to continue to monitor her saturated fat intake and opt for lean cuts of meat and decrease using oils and butters to cook.  She will need repeat labs to be done in February 2026 to reevaluate her cholesterol levels.  She is working on limiting her saturated fat intake to less than 20% of total daily intake. Obesity with starting BMI of 30.2  BMI 32.0-32.9,adult    Diet: Favor is currently in the action stage of change. As such, her goal is to continue with weight loss efforts and has agreed to practicing portion control and making smarter food choices, such as increasing vegetables and decreasing simple carbohydrates.   Exercise:  For substantial health benefits, adults should do at least 150 minutes (2 hours and 30 minutes) a week of moderate-intensity, or 75 minutes (1 hour and 15 minutes) a week of vigorous-intensity aerobic physical activity, or an equivalent combination of moderate- and vigorous-intensity aerobic activity. Aerobic activity should be performed in episodes of at least 10 minutes, and preferably, it should be spread throughout the week.  Behavior Modification:  We discussed the following Behavioral Modification Strategies today: increasing lean protein intake, decreasing simple carbohydrates, increasing vegetables, meal planning and cooking strategies, keeping  healthy foods in the home, planning for success, and keep a strict food journal.   Return in about 5 weeks (around 06/12/2024).   She was informed of the importance of frequent follow up visits to maximize her success with intensive lifestyle modifications for her multiple health conditions.  Attestation Statements:   Reviewed by clinician on day of visit: allergies, medications, problem list, medical history, surgical history, family  history, social history, and previous encounter notes.     [name], MD

## 2024-05-08 NOTE — Assessment & Plan Note (Signed)
 Recent labs done at primary care shows still an elevated cholesterol that is slightly improved into the 150s.  However upon risk stratifying patient is still lower risk than anticipated given her elevated HDL and lack of other comorbidities.  Patient was encouraged to continue to monitor her saturated fat intake and opt for lean cuts of meat and decrease using oils and butters to cook.  She will need repeat labs to be done in February 2026 to reevaluate her cholesterol levels.  She is working on limiting her saturated fat intake to less than 20% of total daily intake.

## 2024-06-02 ENCOUNTER — Ambulatory Visit (INDEPENDENT_AMBULATORY_CARE_PROVIDER_SITE_OTHER): Admitting: Family Medicine

## 2024-06-02 VITALS — BP 105/64 | HR 88 | Temp 98.3°F | Ht 64.0 in | Wt 187.0 lb

## 2024-06-02 DIAGNOSIS — E66811 Obesity, class 1: Secondary | ICD-10-CM

## 2024-06-02 DIAGNOSIS — E669 Obesity, unspecified: Secondary | ICD-10-CM | POA: Diagnosis not present

## 2024-06-02 DIAGNOSIS — R7303 Prediabetes: Secondary | ICD-10-CM | POA: Diagnosis not present

## 2024-06-02 DIAGNOSIS — Z6832 Body mass index (BMI) 32.0-32.9, adult: Secondary | ICD-10-CM

## 2024-06-02 DIAGNOSIS — E559 Vitamin D deficiency, unspecified: Secondary | ICD-10-CM

## 2024-06-02 MED ORDER — VITAMIN D (ERGOCALCIFEROL) 1.25 MG (50000 UNIT) PO CAPS: 50000.0000 [IU] | ORAL_CAPSULE | ORAL | 0 refills | Status: AC

## 2024-06-02 NOTE — Assessment & Plan Note (Signed)
 A1c of 5.5 in May- will need repeat labs at next appointment to assess response of lifestyle changes patient has made

## 2024-06-02 NOTE — Assessment & Plan Note (Signed)
 Previously on Vitamin D  supplementation.  Needs refill of Vitamin D  at this time.  Will repeat lab evaluation at next lab draw.

## 2024-06-02 NOTE — Progress Notes (Signed)
 SUBJECTIVE:  Chief Complaint: Obesity  Interim History: Patient has been paying attention to her water  intake over the last 4 weeks.  She has been focusing on cooking the food in her home more frequently.  She is working on no longer eating at nighttime and eliminating her after dinner snack.  She wrote down a few days of intake and was around 77g of protein those days.  She has been trying to be more in control of calorie intake.   Ashley [name] is here to discuss her progress with her obesity treatment plan. She is on the practicing portion control and making smarter food choices, such as increasing vegetables and decreasing simple carbohydrates and states she is following her eating plan approximately 50 % of the time. She states she is exercising 10 minutes 7 times per week.   OBJECTIVE: Visit Diagnoses: Problem List Items Addressed This Visit       Other   Prediabetes - Primary   Obesity   Vitamin D  deficiency   Relevant Medications   Vitamin D , Ergocalciferol , (DRISDOL ) 1.25 MG (50000 UNIT) CAPS capsule   Other Visit Diagnoses       BMI 32.0-32.9,adult           Vitals Temp: 98.3 F (36.8 C) BP: 105/64 Pulse Rate: 88 SpO2: 100 %   Anthropometric Measurements Height: 5' 4 (1.626 m) Weight: 187 lb (84.8 kg) BMI (Calculated): 32.08 Weight at Last Visit: 187 lb Weight Lost Since Last Visit: 0 Weight Gained Since Last Visit: 0 Starting Weight: 176 lb Total Weight Loss (lbs): 0 lb (0 kg)   Body Composition  Body Fat %: 40.5 % Fat Mass (lbs): 75.8 lbs Muscle Mass (lbs): 105.6 lbs Total Body Water  (lbs): 73.6 lbs Visceral Fat Rating : 9   Other Clinical Data Fasting: no Labs: no Today's Visit #: 39 Starting Date: 04/13/21 Comments: PC/[place]     ASSESSMENT AND PLAN: Assessment & Plan Prediabetes A1c of 5.5 in May- will need repeat labs at next appointment to assess response of lifestyle changes patient has made Vitamin D  deficiency Previously on Vitamin  D supplementation.  Needs refill of Vitamin D  at this time.  Will repeat lab evaluation at next lab draw. Obesity with starting BMI of 30.2  BMI 32.0-32.9,adult    Diet: Ashley [name] is currently in the action stage of change. As such, her goal is to continue with weight loss efforts and has agreed to practicing portion control and making smarter food choices, such as increasing vegetables and decreasing simple carbohydrates.   Exercise:  For substantial health benefits, adults should do at least 150 minutes (2 hours and 30 minutes) a week of moderate-intensity, or 75 minutes (1 hour and 15 minutes) a week of vigorous-intensity aerobic physical activity, or an equivalent combination of moderate- and vigorous-intensity aerobic activity. Aerobic activity should be performed in episodes of at least 10 minutes, and preferably, it should be spread throughout the week.  Behavior Modification:  We discussed the following Behavioral Modification Strategies today: increasing lean protein intake, decreasing simple carbohydrates, increasing vegetables, meal planning and cooking strategies, and planning for success. We discussed various medication options to help Ashley [name] with her weight loss efforts and we both agreed to contemplate online platform usage for easier access to GLP1 usage.  Return in about 4 weeks (around 06/30/2024).   She was informed of the importance of frequent follow up visits to maximize her success with intensive lifestyle modifications for her multiple health conditions.  Attestation Statements:  Reviewed by clinician on day of visit: allergies, medications, problem list, medical history, surgical history, family history, social history, and previous encounter notes.    Ashley [name] Cho, MD

## 2024-06-03 ENCOUNTER — Encounter (HOSPITAL_COMMUNITY): Payer: Self-pay | Admitting: *Deleted

## 2024-06-11 ENCOUNTER — Ambulatory Visit (INDEPENDENT_AMBULATORY_CARE_PROVIDER_SITE_OTHER): Admitting: Family Medicine

## 2024-06-13 ENCOUNTER — Other Ambulatory Visit (HOSPITAL_BASED_OUTPATIENT_CLINIC_OR_DEPARTMENT_OTHER): Payer: Self-pay

## 2024-06-13 ENCOUNTER — Encounter (HOSPITAL_BASED_OUTPATIENT_CLINIC_OR_DEPARTMENT_OTHER): Payer: Self-pay

## 2024-07-08 ENCOUNTER — Encounter (INDEPENDENT_AMBULATORY_CARE_PROVIDER_SITE_OTHER): Payer: Self-pay | Admitting: Family Medicine

## 2024-07-08 ENCOUNTER — Telehealth (INDEPENDENT_AMBULATORY_CARE_PROVIDER_SITE_OTHER): Payer: Self-pay | Admitting: Family Medicine

## 2024-07-08 ENCOUNTER — Ambulatory Visit (INDEPENDENT_AMBULATORY_CARE_PROVIDER_SITE_OTHER): Admitting: Family Medicine

## 2024-07-08 VITALS — BP 114/67 | HR 78 | Temp 98.2°F | Ht 64.0 in | Wt 188.0 lb

## 2024-07-08 DIAGNOSIS — N951 Menopausal and female climacteric states: Secondary | ICD-10-CM | POA: Diagnosis not present

## 2024-07-08 DIAGNOSIS — Z6832 Body mass index (BMI) 32.0-32.9, adult: Secondary | ICD-10-CM

## 2024-07-08 DIAGNOSIS — E669 Obesity, unspecified: Secondary | ICD-10-CM | POA: Diagnosis not present

## 2024-07-08 DIAGNOSIS — E7849 Other hyperlipidemia: Secondary | ICD-10-CM

## 2024-07-08 DIAGNOSIS — E66811 Obesity, class 1: Secondary | ICD-10-CM

## 2024-07-08 MED ORDER — PREMARIN 0.625 MG/GM VA CREA: 1.0000 | TOPICAL_CREAM | Freq: Every day | VAGINAL | Status: AC

## 2024-07-08 NOTE — Telephone Encounter (Signed)
 Pt called and the fax number she would like the RX sent to is [phone].

## 2024-07-08 NOTE — Progress Notes (Signed)
 SUBJECTIVE:  Chief Complaint: Obesity  Interim History: Patient had a quiet Thanksgiving with her mother.  She did not over indulge.  She is planning to have a few excursions over the next month but nothing that she anticipates being a big obstacle.  She is planning on a trip to [place].  Wants to continue with mindful eating and focusing on portion control.  She ate turkey first at Thanksgiving and is trying to eat protein and then vegetables.  Ashley [name] is here to discuss her progress with her obesity treatment plan. She is on the practicing portion control and making smarter food choices, such as increasing vegetables and decreasing simple carbohydrates and states she is following her eating plan approximately 80 % of the time. She states she is exercising 20 minutes 7 times per week- walking with her dog or on the treadmill more frequently.    OBJECTIVE: Visit Diagnoses: Problem List Items Addressed This Visit   None Visit Diagnoses       Vasomotor symptoms due to menopause    -  Primary   Relevant Medications   conjugated estrogens (PREMARIN) vaginal cream       Vitals Temp: 98.2 F (36.8 C) BP: 114/67 Pulse Rate: 78 SpO2: 100 %   Anthropometric Measurements Height: 5' 4 (1.626 m) Weight: 188 lb (85.3 kg) BMI (Calculated): 32.25 Weight at Last Visit: 187 lb Weight Lost Since Last Visit: 0 Weight Gained Since Last Visit: 1 Starting Weight: 176 lb Total Weight Loss (lbs): 0 lb (0 kg)   Body Composition  Body Fat %: 40.8 % Fat Mass (lbs): 77 lbs Muscle Mass (lbs): 106 lbs Total Body Water  (lbs): 73.8 lbs Visceral Fat Rating : 9   Other Clinical Data Fasting: no Labs: no Today's Visit #: 40 Starting Date: 04/13/21 Comments: PC/[place]     ASSESSMENT AND PLAN: Assessment & Plan Vasomotor symptoms due to menopause Patient just started on hormone replacement and is currently on estrogen patch, progesterone at night daily as well as Premarin.  She denies  having any response at this current time.  Counseled patient to wait out 3 months before discussing with primary care or any alterations to this regimen. Other hyperlipidemia Patient put into 10 year ASCVD risk calculator with cholesterols from recent labs at PCP.  Total cholesterol at that time 256, HDL 91, LDL 153.  10-year ASCVD risk calculator anticipates 10-year risk at 0.5% which is low risk at this time.  Counseled patient on how to limit saturated fats to less than 20% of her total intake BMI 32.0-32.9,adult  Obesity with starting BMI of 30.2    Diet: Ashley [name] is currently in the action stage of change. As such, her goal is to continue with weight loss efforts and has agreed to practicing portion control and making smarter food choices, such as increasing vegetables and decreasing simple carbohydrates.   Exercise:  All adults should avoid inactivity. Some activity is better than none, and adults who participate in any amount of physical activity, gain some health benefits.  She wants to continue walking her dog or on the treadmill.  Behavior Modification:  We discussed the following Behavioral Modification Strategies today: increasing lean protein intake, decreasing simple carbohydrates, increasing vegetables, meal planning and cooking strategies, and planning for success.   Return in about 6 weeks (around 08/19/2024).   She was informed of the importance of frequent follow up visits to maximize her success with intensive lifestyle modifications for her multiple health conditions.  Attestation Statements:  Reviewed by clinician on day of visit: allergies, medications, problem list, medical history, surgical history, family history, social history, and previous encounter notes.     Ashley Cho, MD

## 2024-07-08 NOTE — Assessment & Plan Note (Signed)
 Patient put into 10 year ASCVD risk calculator with cholesterols from recent labs at PCP.  Total cholesterol at that time 256, HDL 91, LDL 153.  10-year ASCVD risk calculator anticipates 10-year risk at 0.5% which is low risk at this time.  Counseled patient on how to limit saturated fats to less than 20% of her total intake

## 2024-08-12 ENCOUNTER — Ambulatory Visit (INDEPENDENT_AMBULATORY_CARE_PROVIDER_SITE_OTHER): Payer: Self-pay | Admitting: Family Medicine

## 2024-08-12 ENCOUNTER — Encounter (INDEPENDENT_AMBULATORY_CARE_PROVIDER_SITE_OTHER): Payer: Self-pay | Admitting: Family Medicine

## 2024-08-12 VITALS — BP 108/69 | HR 80 | Temp 98.0°F | Ht 64.0 in | Wt 190.0 lb

## 2024-08-12 DIAGNOSIS — Z6832 Body mass index (BMI) 32.0-32.9, adult: Secondary | ICD-10-CM

## 2024-08-12 DIAGNOSIS — E669 Obesity, unspecified: Secondary | ICD-10-CM

## 2024-08-12 DIAGNOSIS — E66811 Obesity, class 1: Secondary | ICD-10-CM

## 2024-08-12 DIAGNOSIS — N951 Menopausal and female climacteric states: Secondary | ICD-10-CM

## 2024-08-12 DIAGNOSIS — E7849 Other hyperlipidemia: Secondary | ICD-10-CM

## 2024-08-12 NOTE — Progress Notes (Signed)
 "  SUBJECTIVE:  Chief Complaint: Obesity  Interim History: Patient had a stressful last few days due to fraudulent charges on her debit card. She started compounded zepbound and did not experience any effects. She is interested in increasing her dosage to see if this helps.  She did not get any cravings  control with zepbound. She is still doing ten minutes a day walking and wants to add an additional 10-15 minutes daily.  She has also switched her largest meal to lunch to get more full throughout the day. She has started looking at [employer] as an option for exercise.  Ashley [name] is here to discuss her progress with her obesity treatment plan. She is on the practicing portion control and making smarter food choices, such as increasing vegetables and decreasing simple carbohydrates and states she is following her eating plan approximately 65-70 % of the time. She states she is walking 10 minutes 7 times per week.   OBJECTIVE: Visit Diagnoses: Problem List Items Addressed This Visit       Other   Obesity   Other hyperlipidemia - Primary   Other Visit Diagnoses       Vasomotor symptoms due to menopause         BMI 32.0-32.9,adult           Vitals Temp: 98 F (36.7 C) BP: 108/69 Pulse Rate: 80 SpO2: 99 %   Anthropometric Measurements Height: 5' 4 (1.626 m) Weight: 190 lb (86.2 kg) BMI (Calculated): 32.6 Weight at Last Visit: 188 lb Weight Lost Since Last Visit: 0 Weight Gained Since Last Visit: 2 Starting Weight: 176 lb   Body Composition  Body Fat %: 40.7 % Fat Mass (lbs): 77.6 lbs Muscle Mass (lbs): 107.2 lbs Total Body Water  (lbs): 73.2 lbs Visceral Fat Rating : 10   Other Clinical Data Today's Visit #: 60 Starting Date: 04/13/21 Comments: [facility]/[place]     ASSESSMENT AND PLAN: Assessment & Plan Other hyperlipidemia Upcoming appointment with pcp.  Will follow up on lab results at next appointment and risk stratify.   Vasomotor symptoms due to  menopause Patient on hormone replacement therapy.  Will defer treatment management to prescribing provider. BMI 32.0-32.9,adult  Obesity with starting BMI of 30.2    Diet: Ashley [name] is currently in the action stage of change. As such, her goal is to continue with weight loss efforts and has agreed to practicing portion control and making smarter food choices, such as increasing vegetables and decreasing simple carbohydrates- patient given protein requirements for weight loss of 1.2-1.5g/kg body weight which puts her at 103-129g of protein daily.  She is going to start logging food intake to get protein more aligned to goals.  Exercise:  For substantial health benefits, adults should do at least 150 minutes (2 hours and 30 minutes) a week of moderate-intensity, or 75 minutes (1 hour and 15 minutes) a week of vigorous-intensity aerobic physical activity, or an equivalent combination of moderate- and vigorous-intensity aerobic activity. Aerobic activity should be performed in episodes of at least 10 minutes, and preferably, it should be spread throughout the week.  Behavior Modification:  We discussed the following Behavioral Modification Strategies today: increasing lean protein intake, decreasing simple carbohydrates, increasing vegetables, meal planning and cooking strategies, planning for success, and keep a strict food journal. We discussed various medication options to help Ashley [name] with her weight loss efforts and we both agreed to increase compounded zepbound to next dose.  Return in about 4 weeks (around 09/09/2024).  She was informed of the importance of frequent follow up visits to maximize her success with intensive lifestyle modifications for her multiple health conditions.  Attestation Statements:   Reviewed by clinician on day of visit: allergies, medications, problem list, medical history, surgical history, family history, social history, and previous encounter notes.    Ashley Cho, MD "

## 2024-08-15 NOTE — Assessment & Plan Note (Signed)
 Upcoming appointment with pcp.  Will follow up on lab results at next appointment and risk stratify.

## 2024-09-16 ENCOUNTER — Ambulatory Visit (INDEPENDENT_AMBULATORY_CARE_PROVIDER_SITE_OTHER): Admitting: Family Medicine

## 2024-10-14 ENCOUNTER — Ambulatory Visit (INDEPENDENT_AMBULATORY_CARE_PROVIDER_SITE_OTHER): Admitting: Family Medicine
